# Patient Record
Sex: Female | Born: 1955 | Race: White | Hispanic: No | Marital: Married | Smoking: Former smoker
Health system: Southern US, Community
[De-identification: ages and names within clinical notes are randomized; demographics above are authoritative.]

## PROBLEM LIST (undated history)

## (undated) DIAGNOSIS — I10 Essential (primary) hypertension: Secondary | ICD-10-CM

## (undated) DIAGNOSIS — G44009 Cluster headache syndrome, unspecified, not intractable: Secondary | ICD-10-CM

## (undated) DIAGNOSIS — F32A Depression, unspecified: Secondary | ICD-10-CM

## (undated) DIAGNOSIS — F329 Major depressive disorder, single episode, unspecified: Secondary | ICD-10-CM

## (undated) DIAGNOSIS — I700 Atherosclerosis of aorta: Secondary | ICD-10-CM

## (undated) DIAGNOSIS — E559 Vitamin D deficiency, unspecified: Secondary | ICD-10-CM

## (undated) DIAGNOSIS — K219 Gastro-esophageal reflux disease without esophagitis: Secondary | ICD-10-CM

## (undated) DIAGNOSIS — K635 Polyp of colon: Secondary | ICD-10-CM

## (undated) DIAGNOSIS — E7800 Pure hypercholesterolemia, unspecified: Secondary | ICD-10-CM

## (undated) DIAGNOSIS — J449 Chronic obstructive pulmonary disease, unspecified: Secondary | ICD-10-CM

## (undated) DIAGNOSIS — I2510 Atherosclerotic heart disease of native coronary artery without angina pectoris: Principal | ICD-10-CM

## (undated) HISTORY — DX: Chronic obstructive pulmonary disease, unspecified: J44.9

## (undated) HISTORY — DX: Gastro-esophageal reflux disease without esophagitis: K21.9

## (undated) HISTORY — DX: Depression, unspecified: F32.A

## (undated) HISTORY — DX: Essential (primary) hypertension: I10

## (undated) HISTORY — DX: Cluster headache syndrome, unspecified, not intractable: G44.009

## (undated) HISTORY — DX: Vitamin D deficiency, unspecified: E55.9

## (undated) HISTORY — DX: Atherosclerotic heart disease of native coronary artery without angina pectoris: I25.10

## (undated) HISTORY — PX: HAND SURGERY: SHX662

## (undated) HISTORY — DX: Pure hypercholesterolemia, unspecified: E78.00

## (undated) HISTORY — DX: Major depressive disorder, single episode, unspecified: F32.9

## (undated) HISTORY — DX: Atherosclerosis of aorta: I70.0

## (undated) HISTORY — DX: Polyp of colon: K63.5

---

## 1997-12-11 ENCOUNTER — Ambulatory Visit (HOSPITAL_COMMUNITY): Admission: RE | Admit: 1997-12-11 | Discharge: 1997-12-11 | Payer: Self-pay | Admitting: *Deleted

## 1997-12-12 ENCOUNTER — Encounter: Payer: Self-pay | Admitting: *Deleted

## 2001-04-05 ENCOUNTER — Encounter: Admission: RE | Admit: 2001-04-05 | Discharge: 2001-04-05 | Payer: Self-pay | Admitting: Family Medicine

## 2001-04-05 ENCOUNTER — Encounter: Payer: Self-pay | Admitting: Family Medicine

## 2002-09-19 ENCOUNTER — Other Ambulatory Visit: Admission: RE | Admit: 2002-09-19 | Discharge: 2002-09-19 | Payer: Self-pay | Admitting: *Deleted

## 2002-10-03 ENCOUNTER — Encounter: Admission: RE | Admit: 2002-10-03 | Discharge: 2002-10-03 | Payer: Self-pay | Admitting: Family Medicine

## 2002-10-03 ENCOUNTER — Encounter: Payer: Self-pay | Admitting: Family Medicine

## 2002-10-27 ENCOUNTER — Ambulatory Visit (HOSPITAL_BASED_OUTPATIENT_CLINIC_OR_DEPARTMENT_OTHER): Admission: RE | Admit: 2002-10-27 | Discharge: 2002-10-27 | Payer: Self-pay | Admitting: Obstetrics and Gynecology

## 2002-10-27 ENCOUNTER — Encounter (INDEPENDENT_AMBULATORY_CARE_PROVIDER_SITE_OTHER): Payer: Self-pay | Admitting: Specialist

## 2003-10-16 ENCOUNTER — Other Ambulatory Visit: Admission: RE | Admit: 2003-10-16 | Discharge: 2003-10-16 | Payer: Self-pay | Admitting: Obstetrics and Gynecology

## 2003-11-14 ENCOUNTER — Encounter: Admission: RE | Admit: 2003-11-14 | Discharge: 2003-11-14 | Payer: Self-pay | Admitting: Obstetrics and Gynecology

## 2005-03-12 ENCOUNTER — Encounter: Admission: RE | Admit: 2005-03-12 | Discharge: 2005-03-12 | Payer: Self-pay | Admitting: Family Medicine

## 2005-03-12 ENCOUNTER — Other Ambulatory Visit: Admission: RE | Admit: 2005-03-12 | Discharge: 2005-03-12 | Payer: Self-pay | Admitting: Family Medicine

## 2006-04-26 ENCOUNTER — Ambulatory Visit (HOSPITAL_COMMUNITY): Admission: RE | Admit: 2006-04-26 | Discharge: 2006-04-26 | Payer: Self-pay | Admitting: Family Medicine

## 2006-05-12 ENCOUNTER — Encounter: Admission: RE | Admit: 2006-05-12 | Discharge: 2006-05-12 | Payer: Self-pay | Admitting: Family Medicine

## 2006-05-28 ENCOUNTER — Other Ambulatory Visit: Admission: RE | Admit: 2006-05-28 | Discharge: 2006-05-28 | Payer: Self-pay | Admitting: Family Medicine

## 2006-10-22 ENCOUNTER — Encounter: Admission: RE | Admit: 2006-10-22 | Discharge: 2006-10-22 | Payer: Self-pay | Admitting: Family Medicine

## 2007-05-17 ENCOUNTER — Encounter: Admission: RE | Admit: 2007-05-17 | Discharge: 2007-05-17 | Payer: Self-pay | Admitting: Family Medicine

## 2007-08-31 ENCOUNTER — Other Ambulatory Visit: Admission: RE | Admit: 2007-08-31 | Discharge: 2007-08-31 | Payer: Self-pay | Admitting: Family Medicine

## 2009-03-04 ENCOUNTER — Encounter: Admission: RE | Admit: 2009-03-04 | Discharge: 2009-03-04 | Payer: Self-pay | Admitting: Family Medicine

## 2009-03-04 ENCOUNTER — Other Ambulatory Visit: Admission: RE | Admit: 2009-03-04 | Discharge: 2009-03-04 | Payer: Self-pay | Admitting: Family Medicine

## 2010-04-16 ENCOUNTER — Other Ambulatory Visit: Payer: Self-pay | Admitting: Family Medicine

## 2010-04-16 DIAGNOSIS — Z1231 Encounter for screening mammogram for malignant neoplasm of breast: Secondary | ICD-10-CM

## 2010-04-16 DIAGNOSIS — Z1239 Encounter for other screening for malignant neoplasm of breast: Secondary | ICD-10-CM

## 2010-04-28 ENCOUNTER — Other Ambulatory Visit (HOSPITAL_COMMUNITY)
Admission: RE | Admit: 2010-04-28 | Discharge: 2010-04-28 | Disposition: A | Discharge status: Home / Self Care | Payer: BC Managed Care – PPO | Source: Ambulatory Visit | Attending: Family Medicine | Admitting: Family Medicine

## 2010-04-28 ENCOUNTER — Ambulatory Visit
Admission: RE | Admit: 2010-04-28 | Discharge: 2010-04-28 | Disposition: A | Discharge status: Home / Self Care | Payer: Self-pay | Source: Ambulatory Visit | Attending: Family Medicine | Admitting: Family Medicine

## 2010-04-28 ENCOUNTER — Other Ambulatory Visit: Payer: Self-pay | Admitting: Family Medicine

## 2010-04-28 DIAGNOSIS — Z1231 Encounter for screening mammogram for malignant neoplasm of breast: Secondary | ICD-10-CM

## 2010-04-28 DIAGNOSIS — Z124 Encounter for screening for malignant neoplasm of cervix: Secondary | ICD-10-CM | POA: Insufficient documentation

## 2010-08-08 NOTE — Op Note (Signed)
NAME:  Laura Hayes, Laura Hayes                      ACCOUNT NO.:  [id number]   MEDICAL RECORD NO.:  [id number]                   PATIENT TYPE:  AMB   LOCATION:  [facility]                                 FACILITY:  [facility]   PHYSICIAN:  Laqueta Linden, M.D.                 DATE OF BIRTH:  [birth year]   DATE OF PROCEDURE:  10/27/2002  DATE OF DISCHARGE:                                 OPERATIVE REPORT   PREOPERATIVE DIAGNOSIS:  Perimenopausal abnormal bleeding due to endometrial  polyp.   POSTOPERATIVE DIAGNOSIS:   PROCEDURE:  Hysteroscopic resection with curettage.   SURGEON:  Laqueta Linden, M.D.   ANESTHESIA:  General LMA.   ESTIMATED BLOOD LOSS:  Less than 20 mL.   SORBITOL NET INTAKE:  Less than 100 mL.   COMPLICATIONS:  None.   INDICATIONS:  Laura Hayes is a 55 year old, para 2, perimenopausal  female, who presented with abnormal heavy and prolonged perimenopausal  bleeding.  She underwent endometrial sampling which was negative.  She  underwent an ultrasound with sonohysterogram revealing a 1.6 cm endometrial  polyp.  She has seen the informed consent film, voiced her understanding and  acceptance of all risks including but not limited to anesthesia risk,  infection, bleeding, uterine perforation with possible injury to underlying  structures requiring laparoscopy or laparotomy as well as the possibility of  regrowth of the polyp, the unlike possibility of malignancy in the polyp,  and need for further treatment as well as perimenopausal abnormal bleeding  related to hormonal fluctuations even once the polyp has been removed.  She  has seen the informed consent film, voiced her understanding and acceptance  of all risks, and agrees to proceed.   DESCRIPTION OF PROCEDURE:  The patient was taken to the operating room and  after proper identification and consents were ascertained, she was placed on  the operating room table in supine position.  After general LMA was  introduced, she was placed in the [place] stirrups, and the perineum and  vagina were prepped and draped in a routine sterile fashion.  A  transurethral Foley was placed which was removed at the conclusion of the  procedure.  Bimanual examination confirmed an anterior, very small, mobile  uterus.  A speculum was placed in the vagina.  The uterine cavity sounded to  7 cm.  The internal os was gently dilated to a #33 Pratt dilator.  The  resectoscope was then inserted, and a large polyp emanating from the fundus  and basically filling the cavity was identified.  The resectoscope was  placed on routine settings, and the polyp was systematically resected in  multiple pieces.  The base was cauterized.  Hemostasis was excellent.  There  were no other focal lesions noted.  Sharp curettage produced a minimal  amount of additional tissue with specimens sent separately.  Sorbitol  intakes and outputs were monitored throughout the procedure with a net  intake of less than 100 mL.  All instruments were then removed.  The  tenaculum site was hemostatic.  There was no active bleeding from the  cervix.  Estimated blood loss was less than 20 mL.  Specimens were sent to  pathology.  The patient received Toradol 30 mg IV, 30 mg IM at the  initiation of the procedure.  She was awakened, extubated, transferred to  the PACU.  She will be observed and discharged per  anesthesia protocol.  She is to follow up in the office in 2-3 weeks' time  or to call sooner for excessive pain, fever, bleeding, or other concerns.  She is given routine verbal and written discharge instructions and told to  take Advil or Aleve as needed for cramping.                                               Laqueta Linden, M.D.    LKS/MEDQ  D:  10/27/2002  T:  10/27/2002  Job:  [id number]   cc:   Duncan Dull, M.D.  [address]  [place]  [place] [address]  Fax: [phone]

## 2011-04-07 ENCOUNTER — Other Ambulatory Visit: Payer: Self-pay | Admitting: Family Medicine

## 2011-04-07 DIAGNOSIS — Z1231 Encounter for screening mammogram for malignant neoplasm of breast: Secondary | ICD-10-CM

## 2011-04-30 ENCOUNTER — Ambulatory Visit
Admission: RE | Admit: 2011-04-30 | Discharge: 2011-04-30 | Disposition: A | Discharge status: Home / Self Care | Payer: BC Managed Care – PPO | Source: Ambulatory Visit | Attending: Family Medicine | Admitting: Family Medicine

## 2011-04-30 DIAGNOSIS — Z1231 Encounter for screening mammogram for malignant neoplasm of breast: Secondary | ICD-10-CM

## 2011-05-06 ENCOUNTER — Other Ambulatory Visit: Payer: Self-pay | Admitting: Family Medicine

## 2011-05-06 DIAGNOSIS — R928 Other abnormal and inconclusive findings on diagnostic imaging of breast: Secondary | ICD-10-CM

## 2011-05-18 ENCOUNTER — Ambulatory Visit
Admission: RE | Admit: 2011-05-18 | Discharge: 2011-05-18 | Disposition: A | Discharge status: Home / Self Care | Payer: BC Managed Care – PPO | Source: Ambulatory Visit | Attending: Family Medicine | Admitting: Family Medicine

## 2011-05-18 DIAGNOSIS — R928 Other abnormal and inconclusive findings on diagnostic imaging of breast: Secondary | ICD-10-CM

## 2012-05-23 ENCOUNTER — Other Ambulatory Visit: Payer: Self-pay | Admitting: Family Medicine

## 2012-05-23 DIAGNOSIS — R921 Mammographic calcification found on diagnostic imaging of breast: Secondary | ICD-10-CM

## 2012-06-03 ENCOUNTER — Ambulatory Visit
Admission: RE | Admit: 2012-06-03 | Discharge: 2012-06-03 | Disposition: A | Discharge status: Home / Self Care | Payer: BC Managed Care – PPO | Source: Ambulatory Visit | Attending: Family Medicine | Admitting: Family Medicine

## 2012-06-03 DIAGNOSIS — R921 Mammographic calcification found on diagnostic imaging of breast: Secondary | ICD-10-CM

## 2012-10-21 ENCOUNTER — Other Ambulatory Visit: Payer: Self-pay | Admitting: Gastroenterology

## 2012-11-29 ENCOUNTER — Ambulatory Visit
Admission: RE | Admit: 2012-11-29 | Discharge: 2012-11-29 | Disposition: A | Discharge status: Home / Self Care | Payer: BC Managed Care – PPO | Source: Ambulatory Visit | Attending: Family Medicine | Admitting: Family Medicine

## 2012-11-29 ENCOUNTER — Other Ambulatory Visit: Payer: Self-pay | Admitting: Family Medicine

## 2012-11-29 DIAGNOSIS — M545 Low back pain: Secondary | ICD-10-CM

## 2013-01-04 ENCOUNTER — Ambulatory Visit (HOSPITAL_COMMUNITY): Payer: BC Managed Care – PPO | Admitting: Physical Therapy

## 2013-03-23 DIAGNOSIS — N200 Calculus of kidney: Secondary | ICD-10-CM

## 2013-03-23 HISTORY — DX: Calculus of kidney: N20.0

## 2013-03-24 ENCOUNTER — Ambulatory Visit (INDEPENDENT_AMBULATORY_CARE_PROVIDER_SITE_OTHER): Payer: BC Managed Care – PPO | Admitting: Internal Medicine

## 2013-03-24 ENCOUNTER — Other Ambulatory Visit: Payer: Self-pay

## 2013-03-24 DIAGNOSIS — R0600 Dyspnea, unspecified: Secondary | ICD-10-CM

## 2013-03-24 DIAGNOSIS — R0989 Other specified symptoms and signs involving the circulatory and respiratory systems: Secondary | ICD-10-CM

## 2013-03-24 DIAGNOSIS — R0609 Other forms of dyspnea: Secondary | ICD-10-CM

## 2013-03-24 LAB — PULMONARY FUNCTION TEST
DL/VA % pred: 76 %
DL/VA: 3.57 ml/min/mmHg/L
DLCO unc % pred: 76 %
DLCO unc: 17.6 ml/min/mmHg
FEF 25-75 POST: 1.73 L/s
FEF 25-75 PRE: 1.53 L/s
FEF2575-%Change-Post: 13 %
FEF2575-%Pred-Post: 74 %
FEF2575-%Pred-Pre: 66 %
FEV1-%Change-Post: 2 %
FEV1-%PRED-PRE: 93 %
FEV1-%Pred-Post: 96 %
FEV1-PRE: 2.33 L
FEV1-Post: 2.39 L
FEV1FVC-%Change-Post: 0 %
FEV1FVC-%Pred-Pre: 92 %
FEV6-%CHANGE-POST: 2 %
FEV6-%Pred-Post: 104 %
FEV6-%Pred-Pre: 102 %
FEV6-POST: 3.23 L
FEV6-PRE: 3.16 L
FEV6FVC-%Change-Post: 0 %
FEV6FVC-%PRED-POST: 101 %
FEV6FVC-%PRED-PRE: 101 %
FVC-%Change-Post: 2 %
FVC-%Pred-Post: 102 %
FVC-%Pred-Pre: 100 %
FVC-POST: 3.29 L
FVC-PRE: 3.22 L
POST FEV6/FVC RATIO: 98 %
Post FEV1/FVC ratio: 73 %
Pre FEV1/FVC ratio: 72 %
Pre FEV6/FVC Ratio: 98 %
RV % PRED: 113 %
RV: 2.19 L
TLC % pred: 110 %
TLC: 5.4 L

## 2013-03-24 NOTE — Progress Notes (Signed)
PFT done today. 

## 2013-08-28 ENCOUNTER — Other Ambulatory Visit: Payer: Self-pay | Admitting: Family Medicine

## 2013-08-28 ENCOUNTER — Ambulatory Visit
Admission: RE | Admit: 2013-08-28 | Discharge: 2013-08-28 | Disposition: A | Discharge status: Home / Self Care | Payer: BC Managed Care – PPO | Source: Ambulatory Visit

## 2013-08-28 ENCOUNTER — Other Ambulatory Visit (HOSPITAL_COMMUNITY)
Admission: RE | Admit: 2013-08-28 | Discharge: 2013-08-28 | Disposition: A | Discharge status: Home / Self Care | Payer: BC Managed Care – PPO | Source: Ambulatory Visit | Attending: Family Medicine | Admitting: Family Medicine

## 2013-08-28 ENCOUNTER — Other Ambulatory Visit: Payer: Self-pay

## 2013-08-28 DIAGNOSIS — Z1231 Encounter for screening mammogram for malignant neoplasm of breast: Secondary | ICD-10-CM

## 2013-08-28 DIAGNOSIS — Z124 Encounter for screening for malignant neoplasm of cervix: Secondary | ICD-10-CM | POA: Insufficient documentation

## 2013-08-29 LAB — CYTOLOGY - PAP

## 2014-06-05 ENCOUNTER — Encounter: Payer: Self-pay | Admitting: *Deleted

## 2014-06-06 ENCOUNTER — Ambulatory Visit (INDEPENDENT_AMBULATORY_CARE_PROVIDER_SITE_OTHER): Payer: BLUE CROSS/BLUE SHIELD | Admitting: Neurology

## 2014-06-06 ENCOUNTER — Encounter: Payer: Self-pay | Admitting: Neurology

## 2014-06-06 VITALS — BP 124/77 | HR 65 | Ht 63.0 in | Wt 135.0 lb

## 2014-06-06 DIAGNOSIS — G43009 Migraine without aura, not intractable, without status migrainosus: Secondary | ICD-10-CM | POA: Diagnosis not present

## 2014-06-06 MED ORDER — SUMATRIPTAN SUCCINATE 50 MG PO TABS: 50.0000 mg | ORAL_TABLET | ORAL | Status: DC | PRN

## 2014-06-06 NOTE — Progress Notes (Signed)
PATIENT: Laura Hayes DOB: 10-Feb-1956  HISTORICAL  Laura Hayes is a 59 yo RH female is referred by Dr. Venida Jarvis for evaluation of headaches  She had a history of hypertension, hyperlipidemia, long-standing history of migraine headaches, getting better over the past few years, but still has about 2 headaches each week, bilateral frontal, retro-orbital area headaches, pressure, relieved by sleep, cold press, ibuprofen, Tylenol as needed, trigger for her headaches are stress, sleep deprivation, hungry,  2 weeks ago, she had one episode of severe headaches, pounding, with light noise sensitivity, nauseous, lasting for more than one week  She denied lateralized motor or sensory deficit. Has never tried triptan treatment in the past, is using biofeedback technique, which has been helpful.  REVIEW OF SYSTEMS: Full 14 system review of systems performed and notable only for headaches, sleepiness, anxiety, not enough sleep, change in appetite, palpitation, ringing ears, cough, joint pain, joint swelling, achy muscles, running nose.  ALLERGIES: No Known Allergies  HOME MEDICATIONS: Current Outpatient Prescriptions  Medication Sig Dispense Refill  . acetaminophen (TYLENOL) 325 MG tablet Take 650 mg by mouth every 6 (six) hours as needed.    . ALPRAZolam (XANAX) 0.5 MG tablet Take 0.5 mg by mouth. 0.5 - 1 tablet qhs prn restlessnes.    Marland Kitchen buPROPion (WELLBUTRIN XL) 300 MG 24 hr tablet Take 300 mg by mouth daily.    . calcium carbonate (TUMS - DOSED IN MG ELEMENTAL CALCIUM) 500 MG chewable tablet Chew 1 tablet by mouth daily as needed for indigestion or heartburn.    . ergocalciferol (VITAMIN D2) 50000 UNITS capsule Take 50,000 Units by mouth once a week.    Marland Kitchen ibuprofen (ADVIL,MOTRIN) 200 MG tablet Take 200 mg by mouth every 6 (six) hours as needed.    Marland Kitchen lisinopril (PRINIVIL,ZESTRIL) 20 MG tablet Take 20 mg by mouth daily.  0  . omeprazole (PRILOSEC) 20 MG capsule Take 20 mg by mouth  daily.    . rosuvastatin (CRESTOR) 20 MG tablet Take 20 mg by mouth daily.    . sertraline (ZOLOFT) 100 MG tablet Take 100 mg by mouth daily.    . verapamil (CALAN-SR) 180 MG CR tablet   0     PAST MEDICAL HISTORY: Past Medical History  Diagnosis Date  . Hypertension   . Hypercholesteremia   . Colon polyps   . Vitamin D deficiency   . Depression   . COPD (chronic obstructive pulmonary disease)   . GERD (gastroesophageal reflux disease)   . Cluster headache     PAST SURGICAL HISTORY: Past Surgical History  Procedure Laterality Date  . Cesarean section    . Hand surgery Right     FAMILY HISTORY: Family History  Problem Relation Age of Onset  . Migraines Mother   . Transient ischemic attack Mother   . Dementia Mother   . Hypertension Mother   . Hyperlipidemia Mother   . Heart disease Father   . Lung disease Father   . Hypertension Father   . Emphysema Father   . COPD Father     SOCIAL HISTORY:  History   Social History  . Marital Status: Married    Spouse Name: N/A  . Number of Children: 1  . Years of Education: 13   Occupational History  . Photographer    Social History Main Topics  . Smoking status: Current Every Day Smoker    Types: E-cigarettes  . Smokeless tobacco: Not on file  . Alcohol Use:  0.0 oz/week    0 Standard drinks or equivalent per week     Comment: Seldom - social  . Drug Use: No     Comment: past hx of marijuana use in 70's  . Sexual Activity: Not on file   Other Topics Concern  . Not on file   Social History Narrative   Lives at home with husband.   Right-handed.   2 liter caffeine daily (diet soda).    PHYSICAL EXAM   Filed Vitals:   06/06/14 0834  BP: 124/77  Pulse: 65  Height: 5\' 3"  (1.6 m)  Weight: 135 lb (61.236 kg)    Not recorded      Body mass index is 23.92 kg/(m^2).  PHYSICAL EXAMNIATION:  Gen: NAD, conversant, well nourised, obese, well groomed                     Cardiovascular: Regular rate  rhythm, no peripheral edema, warm, nontender. Eyes: Conjunctivae clear without exudates or hemorrhage Neck: Supple, no carotid bruise. Pulmonary: Clear to auscultation bilaterally   NEUROLOGICAL EXAM:  MENTAL STATUS: Speech:    Speech is normal; fluent and spontaneous with normal comprehension.  Cognition:    The patient is oriented to person, place, and time;     recent and remote memory intact;     language fluent;     normal attention, concentration,     fund of knowledge.  CRANIAL NERVES: CN II: Visual fields are full to confrontation. Fundoscopic exam is normal with sharp discs and no vascular changes. Venous pulsations are present bilaterally. Pupils are 4 mm and briskly reactive to light. Visual acuity is 20/20 bilaterally. CN III, IV, VI: extraocular movement are normal. No ptosis. CN V: Facial sensation is intact to pinprick in all 3 divisions bilaterally. Corneal responses are intact.  CN VII: Face is symmetric with normal eye closure and smile. CN VIII: Hearing is normal to rubbing fingers CN IX, X: Palate elevates symmetrically. Phonation is normal. CN XI: Head turning and shoulder shrug are intact CN XII: Tongue is midline with normal movements and no atrophy.  MOTOR: There is no pronator drift of out-stretched arms. Muscle bulk and tone are normal. Muscle strength is normal.   Shoulder abduction Shoulder external rotation Elbow flexion Elbow extension Wrist flexion Wrist extension Finger abduction Hip flexion Knee flexion Knee extension Ankle dorsi flexion Ankle plantar flexion  R 5 5 5 5 5 5 5 5 5 5 5 5   L 5 5 5 5 5 5 5 5 5 5 5 5     REFLEXES: Reflexes are 2+ and symmetric at the biceps, triceps, knees, and ankles. Plantar responses are flexor.  SENSORY: Light touch, pinprick, position sense, and vibration sense are intact in fingers and toes.  COORDINATION: Rapid alternating movements and fine finger movements are intact. There is no dysmetria on  finger-to-nose and heel-knee-shin. There are no abnormal or extraneous movements.   GAIT/STANCE: Posture is normal. Gait is steady with normal steps, base, arm swing, and turning. Heel and toe walking are normal. Tandem gait is normal.  Romberg is absent.   DIAGNOSTIC DATA (LABS, IMAGING, TESTING) - I reviewed patient records, labs, notes, testing and imaging myself where available.  ASSESSMENT AND PLAN  LEESHA VENO is a 59 y.o. female  with long-standing history of migraine, now presenting with frequent headaches, normal neurological examinations  1, most consistent with migraine 2, Imitrex as needed for abortive treatment 3. Return to clinic in 3  months    Marcial Pacas, M.D. Ph.D.  [facility] [address], [place], [place] [address] Ph: [phone] Fax: [phone]

## 2014-09-19 ENCOUNTER — Encounter: Payer: Self-pay | Admitting: Cardiovascular Disease

## 2014-10-01 DIAGNOSIS — R000 Tachycardia, unspecified: Secondary | ICD-10-CM | POA: Insufficient documentation

## 2014-10-02 ENCOUNTER — Ambulatory Visit (INDEPENDENT_AMBULATORY_CARE_PROVIDER_SITE_OTHER): Payer: BLUE CROSS/BLUE SHIELD

## 2014-10-02 DIAGNOSIS — R000 Tachycardia, unspecified: Secondary | ICD-10-CM

## 2016-01-23 ENCOUNTER — Other Ambulatory Visit: Payer: Self-pay | Admitting: Neurology

## 2016-01-28 ENCOUNTER — Other Ambulatory Visit: Payer: Self-pay | Admitting: Family Medicine

## 2016-01-28 DIAGNOSIS — Z1231 Encounter for screening mammogram for malignant neoplasm of breast: Secondary | ICD-10-CM

## 2016-02-11 ENCOUNTER — Ambulatory Visit: Payer: BLUE CROSS/BLUE SHIELD

## 2016-07-24 ENCOUNTER — Other Ambulatory Visit: Payer: Self-pay | Admitting: Family Medicine

## 2016-07-24 DIAGNOSIS — Z122 Encounter for screening for malignant neoplasm of respiratory organs: Secondary | ICD-10-CM

## 2016-07-27 ENCOUNTER — Ambulatory Visit: Payer: BLUE CROSS/BLUE SHIELD

## 2016-07-28 ENCOUNTER — Ambulatory Visit
Admission: RE | Admit: 2016-07-28 | Discharge: 2016-07-28 | Disposition: A | Discharge status: Home / Self Care | Payer: BLUE CROSS/BLUE SHIELD | Source: Ambulatory Visit | Attending: Family Medicine | Admitting: Family Medicine

## 2016-07-28 DIAGNOSIS — Z122 Encounter for screening for malignant neoplasm of respiratory organs: Secondary | ICD-10-CM

## 2016-08-06 ENCOUNTER — Telehealth: Payer: Self-pay

## 2016-08-06 NOTE — Telephone Encounter (Signed)
SENT NOTES TO SCHEDULING 

## 2016-08-10 ENCOUNTER — Telehealth: Payer: Self-pay | Admitting: Cardiology

## 2016-08-10 NOTE — Telephone Encounter (Signed)
Received records from [facility] for appointment on 09/03/16 with Dr Ellyn Hack.  Records put with Dr Allison Quarry schedule for 09/03/16.

## 2016-08-27 ENCOUNTER — Other Ambulatory Visit: Payer: Self-pay

## 2016-09-03 ENCOUNTER — Ambulatory Visit (INDEPENDENT_AMBULATORY_CARE_PROVIDER_SITE_OTHER): Payer: BLUE CROSS/BLUE SHIELD | Admitting: Cardiology

## 2016-09-03 ENCOUNTER — Encounter: Payer: Self-pay | Admitting: Cardiology

## 2016-09-03 VITALS — BP 122/80 | HR 51 | Ht 63.0 in | Wt 122.0 lb

## 2016-09-03 DIAGNOSIS — E7800 Pure hypercholesterolemia, unspecified: Secondary | ICD-10-CM

## 2016-09-03 DIAGNOSIS — R0609 Other forms of dyspnea: Secondary | ICD-10-CM

## 2016-09-03 DIAGNOSIS — I2510 Atherosclerotic heart disease of native coronary artery without angina pectoris: Secondary | ICD-10-CM

## 2016-09-03 DIAGNOSIS — I10 Essential (primary) hypertension: Secondary | ICD-10-CM | POA: Diagnosis not present

## 2016-09-03 DIAGNOSIS — Z8249 Family history of ischemic heart disease and other diseases of the circulatory system: Secondary | ICD-10-CM | POA: Diagnosis not present

## 2016-09-03 DIAGNOSIS — E785 Hyperlipidemia, unspecified: Secondary | ICD-10-CM | POA: Insufficient documentation

## 2016-09-03 DIAGNOSIS — I700 Atherosclerosis of aorta: Secondary | ICD-10-CM | POA: Insufficient documentation

## 2016-09-03 NOTE — Progress Notes (Signed)
PCP: Darcus Austin, MD  Clinic Note: Chief Complaint  Patient presents with  . New Patient (Initial Visit)    has plaque buildup in aorta    HPI: Laura Hayes is a 61 y.o. female who is being seen today for the evaluation of evaluation of coronary calcification on CT at the request of Darcus Austin, MD. She is a former long-term smoker who quit smoking about a year ago. She had a CT scan done to evaluate for lung cancer screening that is demonstrated coronary artery and aortic calcification.  Laura Hayes was seen on 08/05/2016 by Dr. Inda Merlin to discuss the results of a Chest CT that demonstrates coronary artery & aortic calcification.  Recent Hospitalizations: none  Studies Personally Reviewed - (if available, images/films reviewed: From Epic Chart or Care Everywhere)  CT Chest (for Lung Ca Screening): Normal heart size. No pericardial effusion, thickening or record of calcification. Aortic (both ascending and descending) and great vessel atherosclerosis noted. Atherosclerosis of coronary arteries noted including LAD and RCA along with aortic valve. Multiple small pulmonary nodules noted. Right renal calculus  Interval History: Laura Hayes presents here today to discuss the results of her CT scan. He tries to do daily walking and spends lots of time babysitting her grandchild. She does have exertional shortness of breath, but has attributed this to a long-term smoking. She really doesn't have chest discomfort with exertion, but has not been doing the level of activity that she once did. She is hoping to get into an excised class and is concerned about the findings on the CT scan. She has a relatively significant family history of coronary disease that was essentially silent until her family and had an event. She denies any PND, orthopnea or edema. No rapid irregular heartbeats or palpitations. No sick be/near syncope or TIA symptoms amaurosis fugax. No claudication.    ROS: A  comprehensive was performed. Review of Systems  Constitutional: Negative for malaise/fatigue.  HENT: Positive for congestion. Negative for nosebleeds.   Eyes: Negative for blurred vision.  Respiratory: Positive for cough (chronic (ACE-I was stopped), but still present), sputum production (when allergies active.) and shortness of breath (if exerts heavily.).   Cardiovascular: Negative for claudication and leg swelling.  Gastrointestinal: Negative for blood in stool, constipation, diarrhea and melena.       Some loose stool  Genitourinary: Negative for hematuria.  Musculoskeletal: Positive for back pain (some LBP - sore) and joint pain (wrist tendonitis. L>R). Negative for myalgias.  Skin: Negative.   Neurological: Positive for dizziness (-balance). Negative for focal weakness.       Poor balance - equilibrium; has difficulty doing over the head exercises. .   Endo/Heme/Allergies: Positive for environmental allergies (seasonal - takes Claritin).  Psychiatric/Behavioral: The patient has insomnia (difficult falling asleep - has tried melatonin but not very helpful.  occasionally takes xanax. ).   All other systems reviewed and are negative.   I have reviewed and (if needed) personally updated the patient's problem list, medications, allergies, past medical and surgical history, social and family history.   Past Medical History:  Diagnosis Date  . Aortic calcification (HCC)   . Cluster headache   . Colon polyps   . COPD (chronic obstructive pulmonary disease) ([facility])   . Coronary artery calcification seen on CAT scan   . Depression   . Essential hypertension   . GERD (gastroesophageal reflux disease)   . Hypercholesteremia   . Vitamin D deficiency     Past Surgical  History:  Procedure Laterality Date  . CESAREAN SECTION    . HAND SURGERY Right     Current Meds  Medication Sig  . acetaminophen (TYLENOL) 325 MG tablet Take 650 mg by mouth every 6 (six) hours as needed.  .  ALPRAZolam (XANAX) 0.5 MG tablet Take 0.5 mg by mouth. 0.5 - 1 tablet qhs prn restlessnes.  Marland Kitchen buPROPion (WELLBUTRIN XL) 300 MG 24 hr tablet Take 300 mg by mouth daily.  . calcium carbonate (TUMS - DOSED IN MG ELEMENTAL CALCIUM) 500 MG chewable tablet Chew 1 tablet by mouth daily as needed for indigestion or heartburn.  . ergocalciferol (VITAMIN D2) 50000 UNITS capsule Take 50,000 Units by mouth once a week.  Marland Kitchen ibuprofen (ADVIL,MOTRIN) 200 MG tablet Take 200 mg by mouth every 6 (six) hours as needed.  Marland Kitchen losartan (COZAAR) 100 MG tablet Take 1 tablet by mouth daily.  . rosuvastatin (CRESTOR) 20 MG tablet Take 20 mg by mouth daily.  . sertraline (ZOLOFT) 100 MG tablet Take 100 mg by mouth daily.  . SUMAtriptan (IMITREX) 50 MG tablet Take 1 tablet (50 mg total) by mouth every 2 (two) hours as needed for migraine. May repeat in 2 hours if headache persists or recurs.  . verapamil (CALAN-SR) 180 MG CR tablet     Allergies  Allergen Reactions  . Lisinopril Cough    Social History   Social History  . Marital status: Married    Spouse name: N/A  . Number of children: 1  . Years of education: 15   Occupational History  . Photographer    Social History Main Topics  . Smoking status: Former Smoker    Types: E-cigarettes    Quit date: 03/06/2015  . Smokeless tobacco: Never Used  . Alcohol use 0.6 oz/week    1 Standard drinks or equivalent per week     Comment: Seldom - social  . Drug use: No     Comment: past hx of marijuana use in 70's  . Sexual activity: Yes   Other Topics Concern  . None   Social History Narrative   Lives at home with husband.   Had 3 total children, 2 living.  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   Right-handed.   2 liter caffeine daily (diet soda).    family history includes COPD in her father; Coronary artery disease in her father; Dementia in her mother; Emphysema in her father; Heart attack in her brother,  maternal grandfather, and paternal grandfather; Hyperlipidemia in her mother; Hypertension in her brother, father, and mother; Lung disease in her father; Migraines in her mother; Other in her son; Stroke in her brother and maternal grandmother; Transient ischemic attack in her mother.  Wt Readings from Last 3 Encounters:  09/03/16 122 lb (55.3 kg)  06/06/14 135 lb (61.2 kg)    PHYSICAL EXAM BP 122/80 (BP Location: Right Arm, Patient Position: Sitting, Cuff Size: Normal)   Pulse (!) 51   Ht 5\' 3"  (1.6 m)   Wt 122 lb (55.3 kg)   BMI 21.61 kg/m  General appearance: alert, cooperative, appears stated age, no distress. Healthy appearing.  Well nourished & well-groomed HEENT: [place]/AT, EOMI, MMM, anicteric sclera Neck: no adenopathy, no carotid bruit and no JVD Lungs: clear to auscultation bilaterally, normal percussion bilaterally and non-labored Heart: regular rate and rhythm, S1 &S2 normal, no murmur, click, rub or gallop; non-displaced PMI Abdomen: soft, non-tender; bowel sounds normal; no masses,  no  organomegaly; no HJR Extremities: extremities normal, atraumatic, no cyanosis, or edema  Pulses: 2+ and symmetric;  Skin: mobility and turgor normal, no lesions. Neurologic: Mental status: Alert & oriented x 3, thought content appropriate; non-focal exam.  Pleasant mood & affect. Cranial nerves: normal (II-XII grossly intact)  PAD Screen 09/03/2016  Previous PAD dx? No  Previous surgical procedure? No  Pain with walking? Yes  Subsides with rest? Yes  Feet/toe relief with dangling? No  Painful, non-healing ulcers? No  Extremities discolored? No  - not limiting - just ache a bit.   Adult ECG Report  Rate: 51 ;  Rhythm: sinus bradycardia and Otherwise normal axis, intervals and durations.;   Narrative Interpretation: Otherwise normal EKG.   Other studies Reviewed: Additional studies/ records that were reviewed today include:  Recent Labs: 614/2018  Na+ 138, K+ 4.2, Cl- 104, HCO3- 27  , BUN 7, Cr 0.76, Glu 93, Ca2+ 9.8; AST 17, ALT, 17, AlkP 107,  T Bili 0.5, TP 6.8,Alb 4.27 September 2015  TC 166, TG 95, HDL 71, LDL 76    ASSESSMENT / PLAN: Problem List Items Addressed This Visit    Coronary artery calcification seen on CAT scan - Primary    Two-vessel coronary calcification noted on CT scan which is somewhat concerning him and that his LAD and RCA. There was not a quantification of the extent of coronary disease, however two-vessel disease she would likely have a relatively high coronary calcium score. In light of the fact she does have some exertional dyspnea and is hoping to do an exercise class, I do want to evaluate for potential ischemic lesions. It would be nice to focus specifically on the coronaries that were noted on CT scan. As such, I think doing coronary CTA with the ability to do CT FFR is a better option than a Myoview stress test which may simply just showed no ischemia but not estimate the severity of her disease.  It would be nice to know how aggressive we would need to be with lipid and hypertension control      Relevant Orders   EKG 12-Lead (Completed)   CT CORONARY MORPH W/CTA COR W/SCORE W/CA W/CM &/OR WO/CM   CT CORONARY FRACTIONAL FLOW RESERVE DATA PREP   CT CORONARY FRACTIONAL FLOW RESERVE FLUID ANALYSIS   Essential hypertension (Chronic)    Well-controlled with verapamil and ARB.      Relevant Orders   EKG 12-Lead (Completed)   CT CORONARY MORPH W/CTA COR W/SCORE W/CA W/CM &/OR WO/CM   CT CORONARY FRACTIONAL FLOW RESERVE DATA PREP   CT CORONARY FRACTIONAL FLOW RESERVE FLUID ANALYSIS   Exertional dyspnea    Although this was not something that she openly mentioned, in passing we did discuss her exercise tolerance level and that is gone down some. She I think contributes it to her smoking history, however cannot exclude an ischemic equivalent. She says that her father never had chest pain There was suggestion of aortic valve calcification, but I  did not hear a murmur, therefore is not likely to be causing an issue.  We discussed various options to evaluate her coronary anatomy, with the advent of coronary CTA with CT FFR, I think this is now becoming a better option to evaluate exertional dyspnea in a patient who has clearly evidence of coronary disease, this will allow [place] to determine extent of disease in both the LAD and RCA and then potentially evaluate for ischemia due to a specific lesion.  Plan for  now is to order coronary CTA with potential CT FFR.      Family history of premature CAD (Chronic)    Relatively significant family history in her father coronary disease that was relatively silent. Clearly has at least two-vessel disease noted on CT scan. Plan is to determine extent of disease and potential severity of disease using coronary CTA/CT FFR.      Relevant Orders   EKG 12-Lead (Completed)   CT CORONARY MORPH W/CTA COR W/SCORE W/CA W/CM &/OR WO/CM   CT CORONARY FRACTIONAL FLOW RESERVE DATA PREP   CT CORONARY FRACTIONAL FLOW RESERVE FLUID ANALYSIS   Hypercholesteremia    She is on Crestor. Last set of labs are actually pretty well-controlled for her obvious evidence of coronary disease. LDL goal would be closer to 70 than 100.      Hyperlipidemia with target LDL less than 70 (Chronic)   Relevant Orders   EKG 12-Lead (Completed)   Thoracic aortic atherosclerosis (HCC) (Chronic)    I did not order this occasion, because that was not the focus of our evaluation, however I think it would not be unreasonable to do a screening abdominal aortic Doppler ultrasound was feet verified the presence or absence of significant coronary disease.      Relevant Orders   EKG 12-Lead (Completed)   CT CORONARY MORPH W/CTA COR W/SCORE W/CA W/CM &/OR WO/CM   CT CORONARY FRACTIONAL FLOW RESERVE DATA PREP   CT CORONARY FRACTIONAL FLOW RESERVE FLUID ANALYSIS      Current medicines are reviewed at length with the patient today. (+/-  concerns) n/a The following changes have been made: n/a  Patient Instructions  SCHEDULE AT [facility] has requested that you have cardiac CT. Cardiac computed tomography (CT) is a painless test that uses an x-ray machine to take clear, detailed pictures of your heart. For further information please visit [email or web address]. Please follow instruction sheet as given. WILL BE Schedule for July OR AUGUST 2018.    Your physician recommends that you schedule a follow-up appointment in 2 months with DR Selinda Korzeniewski- AFTER THE ABOVE TEST RESULTS.       Studies Ordered:   Orders Placed This Encounter  Procedures  . CT CORONARY MORPH W/CTA COR W/SCORE W/CA W/CM &/OR WO/CM  . CT CORONARY FRACTIONAL FLOW RESERVE DATA PREP  . CT CORONARY FRACTIONAL FLOW RESERVE FLUID ANALYSIS  . EKG 12-Lead      Glenetta Hew, M.D., M.S. Interventional Cardiologist   Pager # [phone] Phone # [phone] [address] [place], [place] [address]

## 2016-09-03 NOTE — Patient Instructions (Signed)
SCHEDULE AT [facility] has requested that you have cardiac CT. Cardiac computed tomography (CT) is a painless test that uses an x-ray machine to take clear, detailed pictures of your heart. For further information please visit [email or web address]. Please follow instruction sheet as given. WILL BE Schedule for July OR AUGUST 2018.    Your physician recommends that you schedule a follow-up appointment in 2 months with DR HARDING- AFTER THE ABOVE TEST RESULTS.

## 2016-09-09 ENCOUNTER — Encounter: Payer: Self-pay | Admitting: Cardiology

## 2016-09-09 DIAGNOSIS — E7800 Pure hypercholesterolemia, unspecified: Secondary | ICD-10-CM | POA: Insufficient documentation

## 2016-09-09 DIAGNOSIS — R0609 Other forms of dyspnea: Secondary | ICD-10-CM

## 2016-09-09 DIAGNOSIS — I700 Atherosclerosis of aorta: Secondary | ICD-10-CM | POA: Insufficient documentation

## 2016-09-09 NOTE — Assessment & Plan Note (Signed)
She is on Crestor. Last set of labs are actually pretty well-controlled for her obvious evidence of coronary disease. LDL goal would be closer to 70 than 100.

## 2016-09-09 NOTE — Assessment & Plan Note (Signed)
Well-controlled with verapamil and ARB.

## 2016-09-09 NOTE — Assessment & Plan Note (Signed)
Two-vessel coronary calcification noted on CT scan which is somewhat concerning him and that his LAD and RCA. There was not a quantification of the extent of coronary disease, however two-vessel disease she would likely have a relatively high coronary calcium score. In light of the fact she does have some exertional dyspnea and is hoping to do an exercise class, I do want to evaluate for potential ischemic lesions. It would be nice to focus specifically on the coronaries that were noted on CT scan. As such, I think doing coronary CTA with the ability to do CT FFR is a better option than a Myoview stress test which may simply just showed no ischemia but not estimate the severity of her disease.  It would be nice to know how aggressive we would need to be with lipid and hypertension control

## 2016-09-09 NOTE — Assessment & Plan Note (Signed)
I did not order this occasion, because that was not the focus of our evaluation, however I think it would not be unreasonable to do a screening abdominal aortic Doppler ultrasound was feet verified the presence or absence of significant coronary disease.

## 2016-09-09 NOTE — Assessment & Plan Note (Signed)
Although this was not something that she openly mentioned, in passing we did discuss her exercise tolerance level and that is gone down some. She I think contributes it to her smoking history, however cannot exclude an ischemic equivalent. She says that her father never had chest pain There was suggestion of aortic valve calcification, but I did not hear a murmur, therefore is not likely to be causing an issue.  We discussed various options to evaluate her coronary anatomy, with the advent of coronary CTA with CT FFR, I think this is now becoming a better option to evaluate exertional dyspnea in a patient who has clearly evidence of coronary disease, this will allow [place] to determine extent of disease in both the LAD and RCA and then potentially evaluate for ischemia due to a specific lesion.  Plan for now is to order coronary CTA with potential CT FFR.

## 2016-09-09 NOTE — Assessment & Plan Note (Signed)
Relatively significant family history in her father coronary disease that was relatively silent. Clearly has at least two-vessel disease noted on CT scan. Plan is to determine extent of disease and potential severity of disease using coronary CTA/CT FFR.

## 2016-09-15 ENCOUNTER — Encounter: Payer: Self-pay | Admitting: Cardiology

## 2016-09-20 DIAGNOSIS — I2510 Atherosclerotic heart disease of native coronary artery without angina pectoris: Secondary | ICD-10-CM

## 2016-09-20 DIAGNOSIS — R931 Abnormal findings on diagnostic imaging of heart and coronary circulation: Secondary | ICD-10-CM

## 2016-09-20 HISTORY — DX: Abnormal findings on diagnostic imaging of heart and coronary circulation: R93.1

## 2016-09-20 HISTORY — DX: Atherosclerotic heart disease of native coronary artery without angina pectoris: I25.10

## 2016-09-30 ENCOUNTER — Ambulatory Visit (HOSPITAL_COMMUNITY)
Admission: RE | Admit: 2016-09-30 | Discharge: 2016-09-30 | Disposition: A | Discharge status: Home / Self Care | Payer: BLUE CROSS/BLUE SHIELD | Source: Ambulatory Visit | Attending: Cardiology | Admitting: Cardiology

## 2016-09-30 ENCOUNTER — Encounter (HOSPITAL_COMMUNITY): Payer: Self-pay

## 2016-09-30 ENCOUNTER — Telehealth: Payer: Self-pay

## 2016-09-30 DIAGNOSIS — R079 Chest pain, unspecified: Secondary | ICD-10-CM | POA: Diagnosis not present

## 2016-09-30 DIAGNOSIS — Z8249 Family history of ischemic heart disease and other diseases of the circulatory system: Secondary | ICD-10-CM | POA: Diagnosis present

## 2016-09-30 DIAGNOSIS — I2510 Atherosclerotic heart disease of native coronary artery without angina pectoris: Secondary | ICD-10-CM | POA: Diagnosis present

## 2016-09-30 DIAGNOSIS — I10 Essential (primary) hypertension: Secondary | ICD-10-CM

## 2016-09-30 DIAGNOSIS — I700 Atherosclerosis of aorta: Secondary | ICD-10-CM

## 2016-09-30 DIAGNOSIS — R911 Solitary pulmonary nodule: Secondary | ICD-10-CM | POA: Diagnosis not present

## 2016-09-30 LAB — POCT I-STAT CREATININE: CREATININE: 0.7 mg/dL (ref 0.44–1.00)

## 2016-09-30 MED ORDER — NITROGLYCERIN 0.4 MG SL SUBL
SUBLINGUAL_TABLET | SUBLINGUAL | Status: AC
  Administered 2016-09-30: 0.8 mg via SUBLINGUAL
  Filled 2016-09-30: qty 2

## 2016-09-30 MED ORDER — METOPROLOL TARTRATE 5 MG/5ML IV SOLN
5.0000 mg | Freq: Once | INTRAVENOUS | Status: AC
  Administered 2016-09-30: 5 mg via INTRAVENOUS
  Filled 2016-09-30: qty 5

## 2016-09-30 MED ORDER — METOPROLOL TARTRATE 5 MG/5ML IV SOLN
INTRAVENOUS | Status: AC
  Administered 2016-09-30: 5 mg via INTRAVENOUS
  Filled 2016-09-30: qty 10

## 2016-09-30 MED ORDER — NITROGLYCERIN 0.4 MG SL SUBL
0.8000 mg | SUBLINGUAL_TABLET | Freq: Once | SUBLINGUAL | Status: AC
  Administered 2016-09-30: 0.8 mg via SUBLINGUAL
  Filled 2016-09-30: qty 25

## 2016-09-30 MED ORDER — IOPAMIDOL (ISOVUE-370) INJECTION 76%
INTRAVENOUS | Status: AC
  Administered 2016-09-30: 80 mL
  Filled 2016-09-30: qty 100

## 2016-09-30 MED ORDER — METOPROLOL TARTRATE 5 MG/5ML IV SOLN
5.0000 mg | INTRAVENOUS | Status: DC
  Filled 2016-09-30: qty 5

## 2016-09-30 NOTE — Telephone Encounter (Signed)
Kelly in [place] at [facility] put in order for I stat creatinine, per protocol for CT procedures. Will forward to Dr. Ellyn Hack and his nurse, so they are aware.

## 2016-09-30 NOTE — Telephone Encounter (Signed)
I will be happy to cosign order.  Glenetta Hew, MD

## 2016-10-12 ENCOUNTER — Telehealth (HOSPITAL_COMMUNITY): Payer: Self-pay | Admitting: Cardiology

## 2016-10-12 NOTE — Telephone Encounter (Addendum)
Cardiac CT angiogram result:  -Based on the calcium score being 172, they proceeded with coronary angiogram and this did show areas of 30-50% narrowing in the right coronary artery and left anterior descending artery. These lesions would not be significant enough to consider evaluating with a cardiac catheterization as they're not likely to be causing angina symptoms.  What this does suggest is that she does have nonobstructive coronary artery disease that we need to treat aggressively with risk factor modification.  Noncardiac findings: Small pulmonary nodule noted in the right middle lobe. --We'll defer whether or not further follow-up will need be done to her PCP (Dr. Inda Merlin)  Glenetta Hew, MD    Patient aware and verbalized understanding.  Has appt coming up with PCP.  Request results forwarded to PCP.   Routed to Dr. Inda Merlin.   Patient also wondering if she needs to keep upcoming follow up with Dr. Ellyn Hack.  Advised that this would be good to discuss results further but would verify with him.

## 2016-10-12 NOTE — Telephone Encounter (Signed)
Patient aware and verbalized understanding.  States she will discuss with PCP at her f/u visit which is before appt with Dr. Ellyn Hack.  Will decide based on visit with PCP.

## 2016-10-12 NOTE — Telephone Encounter (Signed)
New message  ° ° °Patient calling for test results.   °

## 2016-10-12 NOTE — Telephone Encounter (Signed)
The study did not show occlusive CAD, but does confirm the presence of CAD.  I can explain that to her & discuss potential f/u as well as med Rx going forward.  If PCP is comfortable managing her going forward, I am OK with that as well.  Usually, they would prefer Cardiology to stay involved.    Glenetta Hew, MD

## 2016-10-21 ENCOUNTER — Other Ambulatory Visit (HOSPITAL_COMMUNITY)
Admission: RE | Admit: 2016-10-21 | Discharge: 2016-10-21 | Disposition: A | Discharge status: Home / Self Care | Payer: BLUE CROSS/BLUE SHIELD | Source: Ambulatory Visit | Attending: Family Medicine | Admitting: Family Medicine

## 2016-10-21 ENCOUNTER — Other Ambulatory Visit: Payer: Self-pay | Admitting: Family Medicine

## 2016-10-21 DIAGNOSIS — Z01411 Encounter for gynecological examination (general) (routine) with abnormal findings: Secondary | ICD-10-CM | POA: Diagnosis not present

## 2016-10-22 LAB — CYTOLOGY - PAP: Diagnosis: NEGATIVE

## 2016-11-02 ENCOUNTER — Ambulatory Visit
Admission: RE | Admit: 2016-11-02 | Discharge: 2016-11-02 | Disposition: A | Discharge status: Home / Self Care | Payer: BLUE CROSS/BLUE SHIELD | Source: Ambulatory Visit | Attending: Family Medicine | Admitting: Family Medicine

## 2016-11-02 ENCOUNTER — Ambulatory Visit: Payer: BLUE CROSS/BLUE SHIELD

## 2016-11-02 DIAGNOSIS — Z1231 Encounter for screening mammogram for malignant neoplasm of breast: Secondary | ICD-10-CM

## 2016-11-13 ENCOUNTER — Ambulatory Visit (INDEPENDENT_AMBULATORY_CARE_PROVIDER_SITE_OTHER): Payer: BLUE CROSS/BLUE SHIELD | Admitting: Cardiology

## 2016-11-13 ENCOUNTER — Encounter: Payer: Self-pay | Admitting: Cardiology

## 2016-11-13 VITALS — BP 108/80 | HR 68 | Ht 63.0 in | Wt 119.4 lb

## 2016-11-13 DIAGNOSIS — I10 Essential (primary) hypertension: Secondary | ICD-10-CM

## 2016-11-13 DIAGNOSIS — Z8249 Family history of ischemic heart disease and other diseases of the circulatory system: Secondary | ICD-10-CM

## 2016-11-13 DIAGNOSIS — I700 Atherosclerosis of aorta: Secondary | ICD-10-CM | POA: Diagnosis not present

## 2016-11-13 DIAGNOSIS — I2510 Atherosclerotic heart disease of native coronary artery without angina pectoris: Secondary | ICD-10-CM

## 2016-11-13 DIAGNOSIS — E785 Hyperlipidemia, unspecified: Secondary | ICD-10-CM

## 2016-11-13 NOTE — Patient Instructions (Signed)
SCHEDULE AT [place] Your physician has requested that you have a carotid duplex. This test is an ultrasound of the carotid arteries in your neck. It looks at blood flow through these arteries that supply the brain with blood. Allow one hour for this exam. There are no restrictions or special instructions.  Your physician has requested that you have an abdominal aorta duplex. During this test, an ultrasound is used to evaluate the aorta. Allow 30 minutes for this exam. Do not eat after midnight the day before and avoid carbonated beverages   Your physician recommends that you schedule a follow-up appointment in 2 MONTHS IF TEST IS ABNORMAL , IF NORMAL -APPOINTMENT ON AN AS NEEDED  BASIS.    If you need a refill on your cardiac medications before your next appointment, please call your pharmacy.

## 2016-11-13 NOTE — Progress Notes (Signed)
PCP: Darcus Austin, MD  Clinic Note: Chief Complaint  Patient presents with  . Follow-up    coronary calcification    HPI: Laura Hayes is a 61 y.o. female who is being seen today for thefollow-up evaluation of coronary artery calcification noted on CT scan at the request of Darcus Austin, MD.  Laura Hayes was initially seeen on 09/03/2016  - she really did not have any significant symptoms of chest discomfort dyspnea with rest or exertion.  Recent Hospitalizations: none  Studies Personally Reviewed - (if available, images/films reviewed: From Epic Chart or Care Everywhere)  Coronary calcium score-CTA 09/30/2016:   4 mm right upper lobe nodule. Nonspecific/benign.  Coronary calcium score: 172 with calcium and mid and distal RCA as well as mid LAD  Right dominant system. <50% mid LAD. <30% mid & distal RCA --> neither lesion is flow limiting   Interval History: She returns today doing quite well without any major symptoms.   No chest pain or shortness of breath with rest or exertion. No PND, orthopnea or edema. No palpitations, lightheadedness, dizziness, weakness or syncope/near syncope. No TIA/amaurosis fugax symptoms. No melena, hematochezia, hematuria, or epstaxis. No claudication.  ROS: A comprehensive was performed. Review of Systems  Constitutional: Negative for malaise/fatigue.  HENT: Negative for congestion.   Respiratory: Positive for cough (chronic).   Musculoskeletal: Positive for back pain (low back pain) and joint pain (wrist tendinitis).  Neurological: Negative for dizziness.  Endo/Heme/Allergies: Positive for environmental allergies.  Psychiatric/Behavioral: The patient has insomnia.   All other systems reviewed and are negative.   I have reviewed and (if needed) personally updated the patient's problem list, medications, allergies, past medical and surgical history, social and family history.   Past Medical History:  Diagnosis Date  .  Aortic calcification (HCC)   . Cluster headache   . Colon polyps   . COPD (chronic obstructive pulmonary disease) ([place])   . Coronary artery calcification seen on CAT scan   . Depression   . Essential hypertension   . GERD (gastroesophageal reflux disease)   . Hypercholesteremia   . Vitamin D deficiency     Past Surgical History:  Procedure Laterality Date  . CESAREAN SECTION    . HAND SURGERY Right     Current Meds  Medication Sig  . acetaminophen (TYLENOL) 325 MG tablet Take 650 mg by mouth every 6 (six) hours as needed.  Marland Kitchen buPROPion (WELLBUTRIN XL) 300 MG 24 hr tablet Take 300 mg by mouth daily.  . calcium carbonate (TUMS - DOSED IN MG ELEMENTAL CALCIUM) 500 MG chewable tablet Chew 1 tablet by mouth daily as needed for indigestion or heartburn.  . ergocalciferol (VITAMIN D2) 50000 UNITS capsule Take 50,000 Units by mouth once a week.  Marland Kitchen ibuprofen (ADVIL,MOTRIN) 200 MG tablet Take 200 mg by mouth every 6 (six) hours as needed.  Marland Kitchen losartan (COZAAR) 100 MG tablet Take 1 tablet by mouth daily.  . rosuvastatin (CRESTOR) 20 MG tablet Take 20 mg by mouth daily.  . sertraline (ZOLOFT) 100 MG tablet Take 100 mg by mouth daily.  . SUMAtriptan (IMITREX) 50 MG tablet Take 1 tablet (50 mg total) by mouth every 2 (two) hours as needed for migraine. May repeat in 2 hours if headache persists or recurs.  . verapamil (CALAN-SR) 180 MG CR tablet     Allergies  Allergen Reactions  . Lisinopril Cough    Social History   Social History  . Marital status: Married  Spouse name: N/A  . Number of children: 1  . Years of education: 21   Occupational History  . Photographer    Social History Main Topics  . Smoking status: Former Smoker    Types: E-cigarettes    Quit date: 03/06/2015  . Smokeless tobacco: Never Used  . Alcohol use 0.6 oz/week    1 Standard drinks or equivalent per week     Comment: Seldom - social  . Drug use: No     Comment: past hx of marijuana use in 70's    . Sexual activity: Yes   Other Topics Concern  . None   Social History Narrative   Lives at home with husband.   Had 3 total children, 2 living.  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   Right-handed.   2 liter caffeine daily (diet soda).    family history includes COPD in her father; Coronary artery disease in her father; Dementia in her mother; Emphysema in her father; Heart attack in her brother, maternal grandfather, and paternal grandfather; Hyperlipidemia in her mother; Hypertension in her brother, father, and mother; Lung disease in her father; Migraines in her mother; Other in her son; Stroke in her brother and maternal grandmother; Transient ischemic attack in her mother.  Wt Readings from Last 3 Encounters:  11/13/16 119 lb 6.4 oz (54.2 kg)  09/03/16 122 lb (55.3 kg)  06/06/14 135 lb (61.2 kg)    PHYSICAL EXAM BP 108/80   Pulse 68   Ht 5\' 3"  (1.6 m)   Wt 119 lb 6.4 oz (54.2 kg)   SpO2 98%   BMI 21.15 kg/m  Physical Exam  Constitutional: She is oriented to person, place, and time. She appears well-developed and well-nourished. No distress.  HENT:  Head: Normocephalic and atraumatic.  Cardiovascular: Normal rate, regular rhythm, normal heart sounds and intact distal pulses.  Exam reveals no gallop and no friction rub.   No murmur heard. Pulmonary/Chest: Effort normal and breath sounds normal. No respiratory distress. She has no wheezes.  Musculoskeletal: Normal range of motion. She exhibits no edema.  Neurological: She is alert and oriented to person, place, and time.  Skin: Skin is warm and dry.  Psychiatric: She has a normal mood and affect. Her behavior is normal. Judgment and thought content normal.  Nursing note and vitals reviewed.    Adult ECG Report None  Other studies Reviewed: Additional studies/ records that were reviewed today include:  Recent Labs:  - followed by PCP (08/2016: TC 166, TG 95, HDL 71, LDL 76  )  ASSESSMENT / PLAN: Problem List Items Addressed This Visit    Coronary artery calcification seen on CAT scan (Chronic)    Coronary CTA confirmed LAD and RCA disease, but nonobstructive and unlikely to be physiologically significant at this time. Plan will be continued risk factor modification. - continue statin, ARB and verapamil given no evidence of heart failure.  She should be fine starting exercise class..      Relevant Orders   VAS US CAROTID   AAA Duplex   Essential hypertension (Chronic)    Well-controlled on current meds.      Relevant Orders   VAS US CAROTID   AAA Duplex   Family history of premature CAD (Chronic)    As she is currently close of the case as her father was when he was diagnosed with coronary disease, this relatively reassuring, overall restricted to cases standpoint.  Hyperlipidemia with target LDL less than 70 (Chronic)    Currently taking Crestor 20 mg daily. Most recent check is pretty much at goal.Would just recommend continued exercise and I suspect it will continue to get closer to goal values.      Thoracic aortic atherosclerosis (HCC) - Primary (Chronic)    We previously discussed carotid Dopplers and abdominal aortic Dopplers given the extent of calcification noted in the thoracic aorta. Would like to exclude evidence of abdominal aorticdisease as well as carotid disease.  Plan:Abdominal aortic duplex and carotid Dopplers.      Relevant Orders   VAS US CAROTID   AAA Duplex      Current medicines are reviewed at length with the patient today. (+/- concerns) Asked about other screening for systemic Atherosclerosis. The following changes have been made: see below - check Carotd Duplex along with AAA [place].   Patient Instructions  SCHEDULE AT [place] Your physician has requested that you have a carotid duplex. This test is an ultrasound of the carotid arteries in your neck. It looks at blood flow through these  arteries that supply the brain with blood. Allow one hour for this exam. There are no restrictions or special instructions.  Your physician has requested that you have an abdominal aorta duplex. During this test, an ultrasound is used to evaluate the aorta. Allow 30 minutes for this exam. Do not eat after midnight the day before and avoid carbonated beverages   Your physician recommends that you schedule a follow-up appointment in 2 MONTHS IF TEST IS ABNORMAL , IF NORMAL -APPOINTMENT ON AN AS NEEDED  BASIS.    If you need a refill on your cardiac medications before your next appointment, please call your pharmacy.     Studies Ordered:   No orders of the defined types were placed in this encounter.     Glenetta Hew, M.D., M.S. Interventional Cardiologist   Pager # [phone] Phone # [phone] [address] [place], [place] [address]

## 2016-11-15 ENCOUNTER — Encounter: Payer: Self-pay | Admitting: Cardiology

## 2016-11-15 NOTE — Assessment & Plan Note (Signed)
As she is currently close of the case as her father was when he was diagnosed with coronary disease, this relatively reassuring, overall restricted to cases standpoint.

## 2016-11-15 NOTE — Assessment & Plan Note (Signed)
We previously discussed carotid Dopplers and abdominal aortic Dopplers given the extent of calcification noted in the thoracic aorta. Would like to exclude evidence of abdominal aorticdisease as well as carotid disease.  Plan:Abdominal aortic duplex and carotid Dopplers.

## 2016-11-15 NOTE — Assessment & Plan Note (Signed)
Currently taking Crestor 20 mg daily. Most recent check is pretty much at goal.Would just recommend continued exercise and I suspect it will continue to get closer to goal values.

## 2016-11-15 NOTE — Assessment & Plan Note (Signed)
Well-controlled on current meds 

## 2016-11-15 NOTE — Assessment & Plan Note (Signed)
Coronary CTA confirmed LAD and RCA disease, but nonobstructive and unlikely to be physiologically significant at this time. Plan will be continued risk factor modification. - continue statin, ARB and verapamil given no evidence of heart failure.  She should be fine starting exercise class.Marland Kitchen

## 2016-11-17 ENCOUNTER — Other Ambulatory Visit: Payer: Self-pay | Admitting: Cardiology

## 2016-11-17 DIAGNOSIS — I700 Atherosclerosis of aorta: Secondary | ICD-10-CM

## 2016-11-18 ENCOUNTER — Ambulatory Visit (HOSPITAL_COMMUNITY)
Admission: RE | Admit: 2016-11-18 | Discharge: 2016-11-18 | Disposition: A | Discharge status: Home / Self Care | Payer: BLUE CROSS/BLUE SHIELD | Source: Ambulatory Visit | Attending: Cardiology | Admitting: Cardiology

## 2016-11-18 DIAGNOSIS — I2510 Atherosclerotic heart disease of native coronary artery without angina pectoris: Secondary | ICD-10-CM | POA: Diagnosis present

## 2016-11-18 DIAGNOSIS — R42 Dizziness and giddiness: Secondary | ICD-10-CM | POA: Diagnosis not present

## 2016-11-18 DIAGNOSIS — I739 Peripheral vascular disease, unspecified: Secondary | ICD-10-CM | POA: Diagnosis not present

## 2016-11-18 DIAGNOSIS — I10 Essential (primary) hypertension: Secondary | ICD-10-CM | POA: Insufficient documentation

## 2016-11-18 DIAGNOSIS — I700 Atherosclerosis of aorta: Secondary | ICD-10-CM

## 2017-01-07 ENCOUNTER — Ambulatory Visit: Payer: BLUE CROSS/BLUE SHIELD | Admitting: Cardiology

## 2017-01-13 ENCOUNTER — Ambulatory Visit: Payer: BLUE CROSS/BLUE SHIELD | Admitting: Cardiovascular Disease

## 2017-01-26 ENCOUNTER — Other Ambulatory Visit: Payer: Self-pay | Admitting: Family Medicine

## 2017-01-26 DIAGNOSIS — R911 Solitary pulmonary nodule: Secondary | ICD-10-CM

## 2017-02-04 ENCOUNTER — Ambulatory Visit
Admission: RE | Admit: 2017-02-04 | Discharge: 2017-02-04 | Disposition: A | Discharge status: Home / Self Care | Payer: BLUE CROSS/BLUE SHIELD | Source: Ambulatory Visit | Attending: Family Medicine | Admitting: Family Medicine

## 2017-02-04 DIAGNOSIS — R911 Solitary pulmonary nodule: Secondary | ICD-10-CM

## 2017-11-08 ENCOUNTER — Other Ambulatory Visit: Payer: Self-pay | Admitting: Family Medicine

## 2017-11-08 DIAGNOSIS — Z1231 Encounter for screening mammogram for malignant neoplasm of breast: Secondary | ICD-10-CM

## 2017-12-09 ENCOUNTER — Ambulatory Visit
Admission: RE | Admit: 2017-12-09 | Discharge: 2017-12-09 | Disposition: A | Discharge status: Home / Self Care | Payer: BLUE CROSS/BLUE SHIELD | Source: Ambulatory Visit | Attending: Family Medicine | Admitting: Family Medicine

## 2017-12-09 DIAGNOSIS — Z1231 Encounter for screening mammogram for malignant neoplasm of breast: Secondary | ICD-10-CM

## 2018-01-26 ENCOUNTER — Other Ambulatory Visit: Payer: Self-pay | Admitting: Family Medicine

## 2018-01-26 DIAGNOSIS — Z0000 Encounter for general adult medical examination without abnormal findings: Secondary | ICD-10-CM

## 2018-01-31 ENCOUNTER — Ambulatory Visit
Admission: RE | Admit: 2018-01-31 | Discharge: 2018-01-31 | Disposition: A | Discharge status: Home / Self Care | Payer: BLUE CROSS/BLUE SHIELD | Source: Ambulatory Visit | Attending: Family Medicine | Admitting: Family Medicine

## 2018-01-31 DIAGNOSIS — Z0000 Encounter for general adult medical examination without abnormal findings: Secondary | ICD-10-CM

## 2018-09-27 DIAGNOSIS — Z0120 Encounter for dental examination and cleaning without abnormal findings: Secondary | ICD-10-CM | POA: Diagnosis not present

## 2018-12-12 DIAGNOSIS — Z0000 Encounter for general adult medical examination without abnormal findings: Secondary | ICD-10-CM | POA: Diagnosis not present

## 2018-12-12 DIAGNOSIS — F329 Major depressive disorder, single episode, unspecified: Secondary | ICD-10-CM | POA: Diagnosis not present

## 2018-12-12 DIAGNOSIS — Z23 Encounter for immunization: Secondary | ICD-10-CM | POA: Diagnosis not present

## 2018-12-12 DIAGNOSIS — J449 Chronic obstructive pulmonary disease, unspecified: Secondary | ICD-10-CM | POA: Diagnosis not present

## 2018-12-13 ENCOUNTER — Other Ambulatory Visit: Payer: Self-pay | Admitting: Family Medicine

## 2018-12-13 DIAGNOSIS — Z1231 Encounter for screening mammogram for malignant neoplasm of breast: Secondary | ICD-10-CM

## 2018-12-15 ENCOUNTER — Other Ambulatory Visit: Payer: Self-pay | Admitting: Family Medicine

## 2018-12-15 DIAGNOSIS — R911 Solitary pulmonary nodule: Secondary | ICD-10-CM

## 2018-12-22 ENCOUNTER — Ambulatory Visit
Admission: RE | Admit: 2018-12-22 | Discharge: 2018-12-22 | Disposition: A | Discharge status: Home / Self Care | Payer: BLUE CROSS/BLUE SHIELD | Source: Ambulatory Visit | Attending: Family Medicine | Admitting: Family Medicine

## 2018-12-22 DIAGNOSIS — R911 Solitary pulmonary nodule: Secondary | ICD-10-CM

## 2019-01-24 ENCOUNTER — Other Ambulatory Visit: Payer: Self-pay

## 2019-01-24 ENCOUNTER — Ambulatory Visit
Admission: RE | Admit: 2019-01-24 | Discharge: 2019-01-24 | Disposition: A | Discharge status: Home / Self Care | Payer: Medicare Other | Source: Ambulatory Visit | Attending: Family Medicine | Admitting: Family Medicine

## 2019-01-24 DIAGNOSIS — Z1231 Encounter for screening mammogram for malignant neoplasm of breast: Secondary | ICD-10-CM

## 2019-02-01 DIAGNOSIS — H5213 Myopia, bilateral: Secondary | ICD-10-CM | POA: Diagnosis not present

## 2019-02-06 ENCOUNTER — Ambulatory Visit: Payer: Medicare Other | Admitting: Cardiology

## 2019-03-27 ENCOUNTER — Ambulatory Visit: Payer: Medicare Other | Admitting: Cardiology

## 2019-04-04 DIAGNOSIS — Z0120 Encounter for dental examination and cleaning without abnormal findings: Secondary | ICD-10-CM | POA: Diagnosis not present

## 2019-05-31 ENCOUNTER — Encounter: Payer: Self-pay | Admitting: Cardiology

## 2019-06-12 DIAGNOSIS — E559 Vitamin D deficiency, unspecified: Secondary | ICD-10-CM | POA: Diagnosis not present

## 2019-06-12 DIAGNOSIS — F329 Major depressive disorder, single episode, unspecified: Secondary | ICD-10-CM | POA: Diagnosis not present

## 2019-06-12 DIAGNOSIS — I10 Essential (primary) hypertension: Secondary | ICD-10-CM | POA: Diagnosis not present

## 2019-06-12 DIAGNOSIS — K219 Gastro-esophageal reflux disease without esophagitis: Secondary | ICD-10-CM | POA: Diagnosis not present

## 2019-06-20 DIAGNOSIS — G500 Trigeminal neuralgia: Secondary | ICD-10-CM | POA: Diagnosis not present

## 2019-07-04 ENCOUNTER — Ambulatory Visit: Payer: Medicare Other | Admitting: Cardiology

## 2019-07-04 ENCOUNTER — Encounter: Payer: Self-pay | Admitting: Cardiology

## 2019-07-04 ENCOUNTER — Other Ambulatory Visit: Payer: Self-pay

## 2019-07-04 VITALS — BP 110/84 | HR 60 | Ht 63.0 in | Wt 128.4 lb

## 2019-07-04 DIAGNOSIS — I700 Atherosclerosis of aorta: Secondary | ICD-10-CM | POA: Diagnosis not present

## 2019-07-04 DIAGNOSIS — I10 Essential (primary) hypertension: Secondary | ICD-10-CM

## 2019-07-04 DIAGNOSIS — I359 Nonrheumatic aortic valve disorder, unspecified: Secondary | ICD-10-CM

## 2019-07-04 DIAGNOSIS — I2510 Atherosclerotic heart disease of native coronary artery without angina pectoris: Secondary | ICD-10-CM

## 2019-07-04 DIAGNOSIS — E785 Hyperlipidemia, unspecified: Secondary | ICD-10-CM | POA: Diagnosis not present

## 2019-07-04 DIAGNOSIS — Z8249 Family history of ischemic heart disease and other diseases of the circulatory system: Secondary | ICD-10-CM

## 2019-07-04 NOTE — Patient Instructions (Signed)
Medication Instructions:  No changes *If you need a refill on your cardiac medications before your next appointment, please call your pharmacy*   Lab Work: Not needed    Testing/Procedures: Will be schedule at [address] CT coronary calcium score. This test is done at [address]. This is $150 out of pocket.   Coronary CalciumScan A coronary calcium scan is an imaging test used to look for deposits of calcium and other fatty materials (plaques) in the inner lining of the blood vessels of the heart (coronary arteries). These deposits of calcium and plaques can partly clog and narrow the coronary arteries without producing any symptoms or warning signs. This puts a person at risk for a heart attack. This test can detect these deposits before symptoms develop. Tell a health care provider about:  Any allergies you have.  All medicines you are taking, including vitamins, herbs, eye drops, creams, and over-the-counter medicines.  Any problems you or family members have had with anesthetic medicines.  Any blood disorders you have.  Any surgeries you have had.  Any medical conditions you have.  Whether you are pregnant or may be pregnant. What are the risks? Generally, this is a safe procedure. However, problems may occur, including:  Harm to a pregnant woman and her unborn baby. This test involves the use of radiation. Radiation exposure can be dangerous to a pregnant woman and her unborn baby. If you are pregnant, you generally should not have this procedure done.  Slight increase in the risk of cancer. This is because of the radiation involved in the test. What happens before the procedure? No preparation is needed for this procedure. What happens during the procedure?  You will undress and remove any jewelry around your neck or chest.  You will put on a hospital gown.  Sticky electrodes will be placed on your chest. The electrodes  will be connected to an electrocardiogram (ECG) machine to record a tracing of the electrical activity of your heart.  A CT scanner will take pictures of your heart. During this time, you will be asked to lie still and hold your breath for 2-3 seconds while a picture of your heart is being taken. The procedure may vary among health care providers and hospitals. What happens after the procedure?  You can get dressed.  You can return to your normal activities.  It is up to you to get the results of your test. Ask your health care provider, or the department that is doing the test, when your results will be ready. Summary  A coronary calcium scan is an imaging test used to look for deposits of calcium and other fatty materials (plaques) in the inner lining of the blood vessels of the heart (coronary arteries).  Generally, this is a safe procedure. Tell your health care provider if you are pregnant or may be pregnant.  No preparation is needed for this procedure.  A CT scanner will take pictures of your heart.  You can return to your normal activities after the scan is done. This information is not intended to replace advice given to you by your health care provider. Make sure you discuss any questions you have with your health care provider. Document Released: 09/05/2007 Document Revised: 01/27/2016 Document Reviewed: 01/27/2016 Elsevier Interactive Patient Education  2017 [place]: At [facility], you and your health needs are our priority.  As part of our continuing mission  to provide you with exceptional heart care, we have created designated Provider Care Teams.  These Care Teams include your primary Cardiologist (physician) and Advanced Practice Providers (APPs -  Physician Assistants and Nurse Practitioners) who all work together to provide you with the care you need, when you need it.     Your next appointment:   4 week(s)  The format for your next  appointment:   Virtual Visit   Provider:   Glenetta Hew, MD   Other Instructions N/A

## 2019-07-04 NOTE — Progress Notes (Signed)
Primary Care Provider: Glenis Smoker, MD Cardiologist: Glenetta Hew, MD Electrophysiologist: None  Clinic Note: Chief Complaint  Patient presents with  . Follow-up    Has quit smoking for over 1 year.  Now off the cigarettes  . Coronary Artery Disease    Noted on CT scan.  No angina    HPI:    Laura Hayes is a 64 y.o. female with a PMH notable for hypertension and hyperlipidemia as well as long-term smoking with CORONARY CALCIFICATION on CT Scan (and family history of CAD) who presents today for cardiology follow-up at the request of Sela Hilding, MD -> in response to follow-up CT scan.  AVEAH COURTOIS was last seen in Aug 2018 to follow-up for coronary calcium score and CTA (reviewed below in PMH) -> coronary CTA showed mild elevation of coronary calcium, but no significant obstructive disease.  Was on good medications.  No changes made. ->  Recommended smoking cessation counseling and exercise.  Recent Hospitalizations: none  Reviewed  CV studies:    The following studies were reviewed today: (if available, images/films reviewed: From Epic Chart or Care Everywhere) Sept 2018 . AAADuplex : + atherosclerosis, no stenosis or AAA.   Carotid dopplers - normal  Screening chest CT October 2020 -multivessel coronary atherosclerosis noted with aortic valve calcification.  Lung-RADS 2, also noted moderate bullous emphysema.  Stable lung nodules.  Esophageal air-fluid level suggesting GERD.  Interval History:   Laura Hayes returns today presumably because her follow-up chest CT showed "multivessel "CAD.  She indicated to her PCP that this has been evaluated, but recommendation was to follow back up with cardiology.  She has not had any cardiac symptoms.  She is brought to amounts that she actually quit smoking about a year ago and is now finally off of the cigarettes for the last month or so.  She is doing great with that.  Her breathing is improved.  She  is not having any resting or exertional chest pain or pressure.  No significant exertional dyspnea.  She is not really exercising routinely, but is staying active.  Cardiovascular Review of Symptoms (Summary) : no chest pain or dyspnea on exertion negative for - edema, irregular heartbeat, orthopnea, palpitations, paroxysmal nocturnal dyspnea, rapid heart rate, shortness of breath or Syncope/near syncope, TIA/amaurosis fugax, claudication  The patient does not have symptoms concerning for COVID-19 infection (fever, chills, cough, or new shortness of breath).  The patient is practicing social distancing & Masking.    REVIEWED OF SYSTEMS   Review of Systems  Constitutional: Negative for malaise/fatigue and weight loss.  HENT: Negative for congestion and nosebleeds.   Respiratory: Negative for cough (Less prominent smoker's cough since she is 1 year out from smoking) and shortness of breath.   Gastrointestinal: Negative for abdominal pain, blood in stool and melena.  Genitourinary: Negative for hematuria.  Musculoskeletal: Positive for back pain (Chronic-limits her ability to exercise).  Neurological: Negative for dizziness, focal weakness and weakness.  Psychiatric/Behavioral: Negative for memory loss. The patient is not nervous/anxious (Well-controlled) and does not have insomnia.    I have reviewed and (if needed) personally updated the patient's problem list, medications, allergies, past medical and surgical history, social and family history.   PAST MEDICAL HISTORY   Past Medical History:  Diagnosis Date  . Aortic calcification (HCC)   . Cluster headache   . Colon polyps   . COPD (chronic obstructive pulmonary disease) ([place])   . Coronary artery calcification seen  on CAT scan 09/2016   Coronary calcium score 172.  Mid LAD <50% & mid RCA 30% calcified lesions.  Nonflow limiting.  . Depression   . Essential hypertension   . GERD (gastroesophageal reflux disease)   .  Hypercholesteremia   . Vitamin D deficiency     PAST SURGICAL HISTORY   Past Surgical History:  Procedure Laterality Date  . CESAREAN SECTION    . HAND SURGERY Right      MEDICATIONS/ALLERGIES   Current Meds  Medication Sig  . acetaminophen (TYLENOL) 325 MG tablet Take 650 mg by mouth every 6 (six) hours as needed.  Marland Kitchen aspirin 81 MG EC tablet Take 81 mg by mouth daily.  Marland Kitchen buPROPion (WELLBUTRIN XL) 300 MG 24 hr tablet Take 300 mg by mouth daily.  . calcium carbonate (TUMS - DOSED IN MG ELEMENTAL CALCIUM) 500 MG chewable tablet Chew 1 tablet by mouth daily as needed for indigestion or heartburn.  . Cholecalciferol (VITAMIN D3) 50 MCG (2000 UT) TABS Take by mouth.  Marland Kitchen ibuprofen (ADVIL,MOTRIN) 200 MG tablet Take 200 mg by mouth every 6 (six) hours as needed.  Marland Kitchen losartan (COZAAR) 100 MG tablet Take 1 tablet by mouth daily.  . rosuvastatin (CRESTOR) 20 MG tablet Take 20 mg by mouth daily.  . sertraline (ZOLOFT) 100 MG tablet Take 100 mg by mouth daily.  . SUMAtriptan (IMITREX) 50 MG tablet Take 1 tablet (50 mg total) by mouth every 2 (two) hours as needed for migraine. May repeat in 2 hours if headache persists or recurs.  . verapamil (CALAN-SR) 180 MG CR tablet   . [DISCONTINUED] ergocalciferol (VITAMIN D2) 50000 UNITS capsule Take 50,000 Units by mouth once a week.    Allergies  Allergen Reactions  . Lisinopril Cough    SOCIAL HISTORY/FAMILY HISTORY   Reviewed in Epic:  Pertinent findings: She quit smoking over a year ago, has now fully weaned off of electronic cigarettes as well.  OBJCTIVE -PE, EKG, labs   Wt Readings from Last 3 Encounters:  07/04/19 128 lb 6.4 oz (58.2 kg)  11/13/16 119 lb 6.4 oz (54.2 kg)  09/03/16 122 lb (55.3 kg)    Physical Exam: BP 110/84   Pulse 60   Ht 5\' 3"  (1.6 m)   Wt 128 lb 6.4 oz (58.2 kg)   SpO2 97%   BMI 22.75 kg/m  Physical Exam  Constitutional: She is oriented to person, place, and time. She appears well-developed and  well-nourished. No distress.  Healthy-appearing.  Well-groomed.  HENT:  Head: Normocephalic and atraumatic.  Neck: No hepatojugular reflux and no JVD present. Carotid bruit is not present.  Cardiovascular: Normal rate, regular rhythm, normal heart sounds and intact distal pulses.  No extrasystoles are present. PMI is not displaced. Exam reveals no gallop and no friction rub.  No murmur heard. Pulmonary/Chest: Effort normal. No respiratory distress. She exhibits no tenderness.  Mild interstitial sounds, but no wheezes rales or rhonchi  Musculoskeletal:        General: No edema. Normal range of motion.     Cervical back: Normal range of motion and neck supple.  Neurological: She is alert and oriented to person, place, and time.  Psychiatric: She has a normal mood and affect. Her behavior is normal. Judgment and thought content normal.  Vitals reviewed.    Adult ECG Report  Rate: 60;  Rhythm: normal sinus rhythm and Cannot exclude septal MI, age-indeterminate.  Otherwise normal axis, intervals and durations.;   Narrative Interpretation: Stable EKG.  Recent Labs: December 12, 2018: TC 174, TG 73, HDL 80, LDL 79. Cr 0.7.  K+ 4.9. No results found for: CHOL, HDL, LDLCALC, LDLDIRECT, TRIG, CHOLHDL Lab Results  Component Value Date   CREATININE 0.70 09/30/2016   No results found for: TSH  ASSESSMENT/PLAN    Problem List Items Addressed This Visit    Essential hypertension (Chronic)    Blood pressure well controlled on current meds.  No change. In the absence of any CHF symptoms, verapamil is reasonable as a substitute for beta-blocker given her bullous emphysema on chest CT      Relevant Medications   aspirin 81 MG EC tablet   Hyperlipidemia with target LDL less than 70 (Chronic)    Target LDL should be less than 100 but if possible less than 70 given her family history of CAD and coronary calcium score. -> Labs been followed by PCP.  Reasonably controlled as of last September.   Low threshold to consider up titration of Crestor up to 40 mg daily.      Relevant Medications   aspirin 81 MG EC tablet   Other Relevant Orders   CT CARDIAC SCORING (Completed)   EKG 12-Lead (Completed)   Coronary artery calcification seen on CAT scan - Primary (Chronic)    Mild to moderate disease noted on coronary CTA.  No active anginal symptoms.  Continue aggressive risk factor modification.  BP well controlled on verapamil plus losartan. Has successfully quit smoking -> congratulated on her efforts. On stable dose of rosuvastatin.  Recent labs show relatively good control.  Not unreasonable to reevaluate with a coronary calcium score just to determine any progression of disease.  Plan:     Reevaluate with CORONARY CALCIUM SCORE - in the absence of symptoms, I do not think ischemic evaluation is necessary.  Continue current management with close monitoring of her lipids.  At this level of calcification, there is no true indication for aspirin however not unreasonable with her family history to take 81 mg aspirin.        Relevant Medications   aspirin 81 MG EC tablet   Other Relevant Orders   CT CARDIAC SCORING (Completed)   EKG 12-Lead (Completed)   Family history of premature CAD (Chronic)    Nonobstructive CAD noted on coronary CTA 3 years ago.  I do not see any reason to reevaluate with a full coronary CTA in the absence of symptoms.  Rechecking coronary calcium score to see if it stable. She is on a pretty good regimen at this time.  May need a little more close control of lipids but otherwise doing well.      Aortic calcification (HCC) (Chronic)    Similar risk factor modification with blood pressure and lipid control.  Smoking cessation is a big step. No evidence of aortic aneurysm.      Relevant Medications   aspirin 81 MG EC tablet   Other Relevant Orders   CT CARDIAC SCORING (Completed)   EKG 12-Lead (Completed)   Aortic valve calcification (Chronic)     Seen on CT scan, but no murmur on exam.  Without a murmur, no reason to check 2D echo.      Relevant Medications   aspirin 81 MG EC tablet      COVID-19 Education: The signs and symptoms of COVID-19 were discussed with the patient and how to seek care for testing (follow up with PCP or arrange E-visit).   The importance of social distancing was discussed today.  I spent a total of 16 minutes with the patient. >  50% of the time was spent in direct patient consultation.  Additional time spent with chart review  / charting (studies, outside notes, etc): 8 Total Time: 24 min   Current medicines are reviewed at length with the patient today.  (+/- concerns) n/a  Notice: This dictation was prepared with Dragon dictation along with smaller phrase technology. Any transcriptional errors that result from this process are unintentional and may not be corrected upon review.  Patient Instructions / Medication Changes & Studies & Tests Ordered   Patient Instructions  Medication Instructions:  No changes *If you need a refill on your cardiac medications before your next appointment, please call your pharmacy*   Lab Work: Not needed    Testing/Procedures: Will be schedule at [facility]  [address] CT coronary calcium score. This test is done at [address]. This is $150 out of pocket.   Coronary CalciumScan A coronary calcium scan is an imaging test used to look for deposits of calcium and other fatty materials (plaques) in the inner lining of the blood vessels of the heart (coronary arteries). These deposits of calcium and plaques can partly clog and narrow the coronary arteries without producing any symptoms or warning signs. This puts a person at risk for a heart attack. This test can detect these deposits before symptoms develop. Tell a health care provider about:  Any allergies you have.  All medicines you are taking, including vitamins, herbs, eye  drops, creams, and over-the-counter medicines.  Any problems you or family members have had with anesthetic medicines.  Any blood disorders you have.  Any surgeries you have had.  Any medical conditions you have.  Whether you are pregnant or may be pregnant. What are the risks? Generally, this is a safe procedure. However, problems may occur, including:  Harm to a pregnant woman and her unborn baby. This test involves the use of radiation. Radiation exposure can be dangerous to a pregnant woman and her unborn baby. If you are pregnant, you generally should not have this procedure done.  Slight increase in the risk of cancer. This is because of the radiation involved in the test. What happens before the procedure? No preparation is needed for this procedure. What happens during the procedure?  You will undress and remove any jewelry around your neck or chest.  You will put on a hospital gown.  Sticky electrodes will be placed on your chest. The electrodes will be connected to an electrocardiogram (ECG) machine to record a tracing of the electrical activity of your heart.  A CT scanner will take pictures of your heart. During this time, you will be asked to lie still and hold your breath for 2-3 seconds while a picture of your heart is being taken. The procedure may vary among health care providers and hospitals. What happens after the procedure?  You can get dressed.  You can return to your normal activities.  It is up to you to get the results of your test. Ask your health care provider, or the department that is doing the test, when your results will be ready. Summary  A coronary calcium scan is an imaging test used to look for deposits of calcium and other fatty materials (plaques) in the inner lining of the blood vessels of the heart (coronary arteries).  Generally, this is a safe procedure. Tell your health care provider if you are pregnant  or may be pregnant.  No  preparation is needed for this procedure.  A CT scanner will take pictures of your heart.  You can return to your normal activities after the scan is done. This information is not intended to replace advice given to you by your health care provider. Make sure you discuss any questions you have with your health care provider. Document Released: 09/05/2007 Document Revised: 01/27/2016 Document Reviewed: 01/27/2016 Elsevier Interactive Patient Education  2017 [place]: At [facility], you and your health needs are our priority.  As part of our continuing mission to provide you with exceptional heart care, we have created designated Provider Care Teams.  These Care Teams include your primary Cardiologist (physician) and Advanced Practice Providers (APPs -  Physician Assistants and Nurse Practitioners) who all work together to provide you with the care you need, when you need it.     Your next appointment:   4 week(s)  The format for your next appointment:   Virtual Visit   Provider:   Glenetta Hew, MD   Other Instructions N/A    Studies Ordered:   Orders Placed This Encounter  Procedures  . CT CARDIAC SCORING  . EKG 12-Lead     Glenetta Hew, M.D., M.S. Interventional Cardiologist   Pager # [phone] Phone # [phone] [address] [place], [place] [address]   Thank you for choosing [facility] at [facility]!!

## 2019-07-06 ENCOUNTER — Other Ambulatory Visit: Payer: Self-pay

## 2019-07-06 ENCOUNTER — Ambulatory Visit (INDEPENDENT_AMBULATORY_CARE_PROVIDER_SITE_OTHER)
Admission: RE | Admit: 2019-07-06 | Discharge: 2019-07-06 | Disposition: A | Discharge status: Home / Self Care | Payer: Self-pay | Source: Ambulatory Visit | Attending: Cardiology | Admitting: Cardiology

## 2019-07-06 DIAGNOSIS — I700 Atherosclerosis of aorta: Secondary | ICD-10-CM

## 2019-07-06 DIAGNOSIS — E785 Hyperlipidemia, unspecified: Secondary | ICD-10-CM

## 2019-07-06 DIAGNOSIS — I2510 Atherosclerotic heart disease of native coronary artery without angina pectoris: Secondary | ICD-10-CM

## 2019-07-08 ENCOUNTER — Encounter: Payer: Self-pay | Admitting: Cardiology

## 2019-07-08 DIAGNOSIS — I359 Nonrheumatic aortic valve disorder, unspecified: Secondary | ICD-10-CM | POA: Insufficient documentation

## 2019-07-08 NOTE — Assessment & Plan Note (Signed)
Target LDL should be less than 100 but if possible less than 70 given her family history of CAD and coronary calcium score. -> Labs been followed by PCP.  Reasonably controlled as of last September.  Low threshold to consider up titration of Crestor up to 40 mg daily.

## 2019-07-08 NOTE — Assessment & Plan Note (Signed)
Blood pressure well controlled on current meds.  No change. In the absence of any CHF symptoms, verapamil is reasonable as a substitute for beta-blocker given her bullous emphysema on chest CT

## 2019-07-08 NOTE — Assessment & Plan Note (Addendum)
Mild to moderate disease noted on coronary CTA.  No active anginal symptoms.  Continue aggressive risk factor modification.  BP well controlled on verapamil plus losartan. Has successfully quit smoking -> congratulated on her efforts. On stable dose of rosuvastatin.  Recent labs show relatively good control.  Not unreasonable to reevaluate with a coronary calcium score just to determine any progression of disease.  Plan:     Reevaluate with CORONARY CALCIUM SCORE - in the absence of symptoms, I do not think ischemic evaluation is necessary.  Continue current management with close monitoring of her lipids.  At this level of calcification, there is no true indication for aspirin however not unreasonable with her family history to take 81 mg aspirin.

## 2019-07-08 NOTE — Assessment & Plan Note (Signed)
Nonobstructive CAD noted on coronary CTA 3 years ago.  I do not see any reason to reevaluate with a full coronary CTA in the absence of symptoms.  Rechecking coronary calcium score to see if it stable. She is on a pretty good regimen at this time.  May need a little more close control of lipids but otherwise doing well.

## 2019-07-08 NOTE — Assessment & Plan Note (Signed)
Similar risk factor modification with blood pressure and lipid control.  Smoking cessation is a big step. No evidence of aortic aneurysm.

## 2019-07-08 NOTE — Assessment & Plan Note (Signed)
Seen on CT scan, but no murmur on exam.  Without a murmur, no reason to check 2D echo.

## 2019-08-01 ENCOUNTER — Ambulatory Visit: Payer: Medicare Other | Admitting: Neurology

## 2019-08-01 ENCOUNTER — Other Ambulatory Visit: Payer: Self-pay

## 2019-08-01 ENCOUNTER — Encounter: Payer: Self-pay | Admitting: Neurology

## 2019-08-01 VITALS — BP 146/86 | HR 63 | Ht 63.0 in | Wt 131.0 lb

## 2019-08-01 DIAGNOSIS — R519 Headache, unspecified: Secondary | ICD-10-CM | POA: Insufficient documentation

## 2019-08-01 DIAGNOSIS — M542 Cervicalgia: Secondary | ICD-10-CM | POA: Diagnosis not present

## 2019-08-01 NOTE — Progress Notes (Signed)
PATIENT: Laura Hayes DOB: 01/02/1956  Chief Complaint  Patient presents with  . New Patient (Initial Visit)    PCP/Referring: Dr. Lindell Noe. Vision: 20/30 with correction bilaterally.  . Pain    Complaining of head pain on the left side around her ear. Started in September of 2019. Describes it as an uncomfortable feeling.     HISTORICAL  JERSIE BEEL is a 64 year old female, seen in request by primary care physician Dr. Glenis Smoker for evaluation of frequent headaches, initial evaluation was on Aug 01, 2019.  I have reviewed and summarized the referring note from the referring physician.  She had a past medical history of hypertension, hyperlipidemia, depression anxiety, has been on stable dose of Wellbutrin, Zoloft for many years, she was a longtime smoker 1 pack a day for 40 years, finally quit around 2018.  She reported long history of chronic migraine headache, her typical migraine are lateralized severe pounding headache, with associated light noise sensitivity, nauseous, worsening by movement, lasting for few hours, improved by sleep, usually triggered by missing her meals.  She reported significant improvement of her migraine headaches since she stopped smoking in 2018, she began to develop different headache since September 2019, she had a sudden onset left parietal area pressure pain 1 night while relaxing, there was no focal signs, she took few tablets of ibuprofen, went to sleep, next day, the moderate to severe pressure headache is much improved, but since then, she continued to experience intermittent left around the ear, occipital parietal area pressure pain, sometimes felt the skin was hypersensitive to touch, she denies rash broke out, denies hearing loss, occasionally left neck pain radiating pain to left shoulder, left deltoid region, she denies diffuse body achy pain, chewing difficulty, visual loss  Laboratory evaluation in September 2020: Normal  BMP, creatinine of 0.87, lipid panel, LDL of 79   REVIEW OF SYSTEMS: Full 14 system review of systems performed and notable only for as above All other review of systems were negative.  ALLERGIES: Allergies  Allergen Reactions  . Lisinopril Cough    HOME MEDICATIONS: Current Outpatient Medications  Medication Sig Dispense Refill  . acetaminophen (TYLENOL) 325 MG tablet Take 650 mg by mouth every 6 (six) hours as needed.    Marland Kitchen aspirin 81 MG EC tablet Take 81 mg by mouth daily.    Marland Kitchen buPROPion (WELLBUTRIN XL) 300 MG 24 hr tablet Take 300 mg by mouth daily.    . calcium carbonate (TUMS - DOSED IN MG ELEMENTAL CALCIUM) 500 MG chewable tablet Chew 1 tablet by mouth daily as needed for indigestion or heartburn.    . Cholecalciferol (VITAMIN D3) 50 MCG (2000 UT) TABS Take by mouth.    Marland Kitchen ibuprofen (ADVIL,MOTRIN) 200 MG tablet Take 200 mg by mouth every 6 (six) hours as needed.    Marland Kitchen losartan (COZAAR) 100 MG tablet Take 1 tablet by mouth daily.    . rosuvastatin (CRESTOR) 20 MG tablet Take 20 mg by mouth daily.    . sertraline (ZOLOFT) 100 MG tablet Take 100 mg by mouth daily.    . SUMAtriptan (IMITREX) 50 MG tablet Take 1 tablet (50 mg total) by mouth every 2 (two) hours as needed for migraine. May repeat in 2 hours if headache persists or recurs. 10 tablet 6  . verapamil (CALAN-SR) 180 MG CR tablet   0   No current facility-administered medications for this visit.    PAST MEDICAL HISTORY: Past Medical History:  Diagnosis Date  .  Aortic calcification (HCC)   . Cluster headache   . Colon polyps   . COPD (chronic obstructive pulmonary disease) ([place])   . Coronary artery calcification seen on CAT scan 09/2016   Coronary calcium score 172.  Mid LAD <50% & mid RCA 30% calcified lesions.  Nonflow limiting.  . Depression   . Essential hypertension   . GERD (gastroesophageal reflux disease)   . Hypercholesteremia   . Vitamin D deficiency     PAST SURGICAL HISTORY: Past Surgical History:    Procedure Laterality Date  . CESAREAN SECTION    . HAND SURGERY Right     FAMILY HISTORY: Family History  Problem Relation Age of Onset  . Migraines Mother   . Transient ischemic attack Mother   . Dementia Mother   . Hypertension Mother   . Hyperlipidemia Mother   . Lung disease Father   . Hypertension Father   . Emphysema Father   . COPD Father   . Coronary artery disease Father        s/p PCI, CABG  . Other Son        Tricuspid atresa with VSD  . Hypertension Brother   . Stroke Maternal Grandmother   . Heart attack Maternal Grandfather   . Heart attack Paternal Grandfather   . Heart attack Brother   . Stroke Brother   . Breast cancer Neg Hx     SOCIAL HISTORY: Social History   Socioeconomic History  . Marital status: Married    Spouse name: Not on file  . Number of children: 1  . Years of education: 32  . Highest education level: Not on file  Occupational History  . Occupation: Photographer  Tobacco Use  . Smoking status: Former Smoker    Types: E-cigarettes    Quit date: 03/06/2015    Years since quitting: 4.4  . Smokeless tobacco: Never Used  Substance and Sexual Activity  . Alcohol use: Yes    Alcohol/week: 1.0 standard drinks    Types: 1 Standard drinks or equivalent per week    Comment: Seldom - social  . Drug use: No    Comment: past hx of marijuana use in 70's  . Sexual activity: Yes  Other Topics Concern  . Not on file  Social History Narrative   Lives at home with husband.   Had 3 total children, 2 living.  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   Right-handed.   2 liter caffeine daily (diet soda).   Social Determinants of Health   Financial Resource Strain:   . Difficulty of Paying Living Expenses:   Food Insecurity:   . Worried About Charity fundraiser in the Last Year:   . Arboriculturist in the Last Year:   Transportation Needs:   . Film/video editor (Medical):   Marland Kitchen Lack of  Transportation (Non-Medical):   Physical Activity:   . Days of Exercise per Week:   . Minutes of Exercise per Session:   Stress:   . Feeling of Stress :   Social Connections:   . Frequency of Communication with Friends and Family:   . Frequency of Social Gatherings with Friends and Family:   . Attends Religious Services:   . Active Member of Clubs or Organizations:   . Attends Archivist Meetings:   Marland Kitchen Marital Status:   Intimate Partner Violence:   . Fear of Current or Ex-Partner:   . Emotionally Abused:   .  Physically Abused:   . Sexually Abused:      PHYSICAL EXAM   Vitals:   08/01/19 1451  BP: (!) 146/86  Pulse: 63  Weight: 131 lb (59.4 kg)  Height: 5' 3"  (1.6 m)    Not recorded      Body mass index is 23.21 kg/m.  PHYSICAL EXAMNIATION:  Gen: NAD, conversant, well nourised, well groomed                     Cardiovascular: Regular rate rhythm, no peripheral edema, warm, nontender. Eyes: Conjunctivae clear without exudates or hemorrhage Neck: Supple, no carotid bruits. Pulmonary: Clear to auscultation bilaterally   NEUROLOGICAL EXAM:  MENTAL STATUS: Speech:    Speech is normal; fluent and spontaneous with normal comprehension.  Cognition:     Orientation to time, place and person     Normal recent and remote memory     Normal Attention span and concentration     Normal Language, naming, repeating,spontaneous speech     Fund of knowledge   CRANIAL NERVES: CN II: Visual fields are full to confrontation. Pupils are round equal and briskly reactive to light. CN III, IV, VI: extraocular movement are normal. No ptosis. CN V: Facial sensation is intact to light touch CN VII: Face is symmetric with normal eye closure  CN VIII: Hearing is normal to causal conversation. CN IX, X: Phonation is normal. CN XI: Head turning and shoulder shrug are intact  MOTOR: There is no pronator drift of out-stretched arms. Muscle bulk and tone are normal. Muscle  strength is normal.  REFLEXES: Reflexes are 2+ and symmetric at the biceps, triceps, knees, and ankles. Plantar responses are flexor.  SENSORY: Intact to light touch, pinprick and vibratory sensation are intact in fingers and toes.  COORDINATION: There is no trunk or limb dysmetria noted.  GAIT/STANCE: Posture is normal. Gait is steady with normal steps, base, arm swing, and turning. Heel and toe walking are normal. Tandem gait is normal.  Romberg is absent.   DIAGNOSTIC DATA (LABS, IMAGING, TESTING) - I reviewed patient records, labs, notes, testing and imaging myself where available.   ASSESSMENT AND PLAN  KEYASIA JOLLIFF is a 64 y.o. female   Left neck pain, radiating pain to left shoulder  MRI of cervical spine to rule out left cervical radiculopathy Left occipital area headache, radiating discomfort to left temporoparietal region, left side headaches  Need to rule out central nervous system structural lesion, with her new onset persistent left-sided headaches,  ESR C-reactive protein to rule out temporal arteritis  I have suggested neck stretching exercise, heating pad, as needed NSAIDs     Marcial Pacas, M.D. Ph.D.  [facility] [address] [place], [place] [address] Ph: [phone] Fax: [phone]  CC: Glenis Smoker, MD

## 2019-08-02 ENCOUNTER — Telehealth: Payer: Self-pay | Admitting: Neurology

## 2019-08-02 LAB — SEDIMENTATION RATE: Sed Rate: 19 mm/hr (ref 0–40)

## 2019-08-02 LAB — C-REACTIVE PROTEIN: CRP: 1 mg/L (ref 0–10)

## 2019-08-02 NOTE — Telephone Encounter (Signed)
[employer] medicare order sent to [facility]. No auth they will reach out to the patient to schedule.  

## 2019-08-07 ENCOUNTER — Telehealth (INDEPENDENT_AMBULATORY_CARE_PROVIDER_SITE_OTHER): Payer: Medicare Other | Admitting: Cardiology

## 2019-08-07 ENCOUNTER — Telehealth: Payer: Self-pay | Admitting: *Deleted

## 2019-08-07 ENCOUNTER — Encounter: Payer: Self-pay | Admitting: Cardiology

## 2019-08-07 VITALS — BP 148/87 | HR 67 | Ht 63.0 in

## 2019-08-07 DIAGNOSIS — I10 Essential (primary) hypertension: Secondary | ICD-10-CM

## 2019-08-07 DIAGNOSIS — I2510 Atherosclerotic heart disease of native coronary artery without angina pectoris: Secondary | ICD-10-CM

## 2019-08-07 DIAGNOSIS — I700 Atherosclerosis of aorta: Secondary | ICD-10-CM | POA: Diagnosis not present

## 2019-08-07 DIAGNOSIS — Z8249 Family history of ischemic heart disease and other diseases of the circulatory system: Secondary | ICD-10-CM

## 2019-08-07 DIAGNOSIS — I359 Nonrheumatic aortic valve disorder, unspecified: Secondary | ICD-10-CM

## 2019-08-07 DIAGNOSIS — E785 Hyperlipidemia, unspecified: Secondary | ICD-10-CM

## 2019-08-07 NOTE — Assessment & Plan Note (Signed)
Blood pressure is little high today.  She is on verapamil because history of tachycardia in the past and that seems be doing pretty well.  I would have her take that at nighttime and continue taking her losartan in the morning.  This would allow [place] to potentially add ARB to the losartan if necessary for more blood pressure control.  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

## 2019-08-07 NOTE — Telephone Encounter (Signed)
RN spoke to patient. Instruction were given  from today's virtual visit 08/07/19 .  AVS SUMMARY has been sent by mychart .   Patient verbalized understanding

## 2019-08-07 NOTE — Assessment & Plan Note (Signed)
Her last LDL look pretty good at 78.  Due to have labs checked soon.  May want to consider up titration of Crestor to 40 mg versus adding Zetia.  Continue to monitor.  Labs being checked by PCP in June-July.

## 2019-08-07 NOTE — Assessment & Plan Note (Signed)
No murmur on exam.  I doubt that there is any significant aortic stenosis.  Continue to monitor exam.

## 2019-08-07 NOTE — Telephone Encounter (Signed)
  Patient Consent for Virtual Visit         Laura Hayes has provided verbal consent on 08/07/2019 for a virtual visit (video or telephone).   CONSENT FOR VIRTUAL VISIT FOR:  Laura Hayes  By participating in this virtual visit I agree to the following:  I hereby voluntarily request, consent and authorize [facility] and its employed or contracted physicians, physician assistants, nurse practitioners or other licensed health care professionals (the Practitioner), to provide me with telemedicine health care services (the "Services") as deemed necessary by the treating Practitioner. I acknowledge and consent to receive the Services by the Practitioner via telemedicine. I understand that the telemedicine visit will involve communicating with the Practitioner through live audiovisual communication technology and the disclosure of certain medical information by electronic transmission. I acknowledge that I have been given the opportunity to request an in-person assessment or other available alternative prior to the telemedicine visit and am voluntarily participating in the telemedicine visit.  I understand that I have the right to withhold or withdraw my consent to the use of telemedicine in the course of my care at any time, without affecting my right to future care or treatment, and that the Practitioner or I may terminate the telemedicine visit at any time. I understand that I have the right to inspect all information obtained and/or recorded in the course of the telemedicine visit and may receive copies of available information for a reasonable fee.  I understand that some of the potential risks of receiving the Services via telemedicine include:  Marland Kitchen Delay or interruption in medical evaluation due to technological equipment failure or disruption; . Information transmitted may not be sufficient (e.g. poor resolution of images) to allow for appropriate medical decision making by the  Practitioner; and/or  . In rare instances, security protocols could fail, causing a breach of personal health information.  Furthermore, I acknowledge that it is my responsibility to provide information about my medical history, conditions and care that is complete and accurate to the best of my ability. I acknowledge that Practitioner's advice, recommendations, and/or decision may be based on factors not within their control, such as incomplete or inaccurate data provided by me or distortions of diagnostic images or specimens that may result from electronic transmissions. I understand that the practice of medicine is not an exact science and that Practitioner makes no warranties or guarantees regarding treatment outcomes. I acknowledge that a copy of this consent can be made available to me via my patient portal ([facility]), or I can request a printed copy by calling the office of [facility].    I understand that my insurance will be billed for this visit.   I have read or had this consent read to me. . I understand the contents of this consent, which adequately explains the benefits and risks of the Services being provided via telemedicine.  . I have been provided ample opportunity to ask questions regarding this consent and the Services and have had my questions answered to my satisfaction. . I give my informed consent for the services to be provided through the use of telemedicine in my medical care

## 2019-08-07 NOTE — Assessment & Plan Note (Signed)
Coronary calcium score is only slightly higher than it had been.  This would show that there is been minimal progression in the last 3 years.  Plan will be to continue aggressive management of risk factors including hypertension hyperlipidemia.  Again may need to increase rosuvastatin.  May need to add HCTZ for better blood pressure control.  She is taking an aspirin based on her family history of CAD.

## 2019-08-07 NOTE — Progress Notes (Signed)
Virtual Visit via Telephone Note   This visit type was conducted due to national recommendations for restrictions regarding the COVID-19 Pandemic (e.g. social distancing) in an effort to limit this patient's exposure and mitigate transmission in our community.  Due to her co-morbid illnesses, this patient is at least at moderate risk for complications without adequate follow up.  This format is felt to be most appropriate for this patient at this time.  The patient did not have access to video technology/had technical difficulties with video requiring transitioning to audio format only (telephone).  All issues noted in this document were discussed and addressed.  No physical exam could be performed with this format.  Please refer to the patient's chart for her  consent to telehealth for [facility].   Patient has given verbal permission to conduct this visit via virtual appointment and to bill insurance 08/07/2019 1:52 PM     Evaluation Performed:  Follow-up visit  Date:  08/07/2019   ID:  Laura Hayes, DOB 10/01/1955, MRN [id number]  Patient Location: Home Provider Location: Home  PCP:  Glenis Smoker, MD  Cardiologist:  Glenetta Hew, MD  Electrophysiologist:  None   Chief Complaint:   Chief Complaint  Patient presents with  . Follow-up    Test results  . Coronary Artery Disease    Coronary calcium score results.  No symptoms.     History of Present Illness:    Laura Hayes is a 64 y.o. female with PMH notable for hypertension and hyperlipidemia as well as coronary calcification seen on CT scan who presents via audio/video conferencing for a telehealth visit today to discuss the results of coronary calcium score.  Laura Hayes was last seen on April 17 for delayed follow-up..  CT scan showed multivessel CAD and PCP recommended follow-up with cardiology.  No cardiac symptoms to speak of. Has quit smoking for over 1 year.  Now off the cigarettes-noting  dyspnea improved.  No chest pain or pressure.  Hospitalizations:  . None   Recent - Interim CV studies:   The following studies were reviewed today: . Coronary calcium score: Increased from 172 up to 239.  Also noted scattered aortic valve and ascending aortic calcification.  Mild progression of disease.  Need to be more aggressive with lipid management.  Inerval History   Laura Hayes is following up via telemedicine visit to discuss results of her coronary calcium score.  She remains totally asymptomatic from a cardiac standpoint.  She is doing well with her smoking cessation.  Not noting any exertional dyspnea or chest pain.  No heart failure symptoms of PND, orthopnea or edema.  She is making a concerted effort to increase her level of exercise. She is little concerned about her blood pressure being elevated today, but usually is not that high.  No headaches or blurred vision.  Cardiovascular ROS: no chest pain or dyspnea on exertion positive for - Some somewhat elevated blood pressure readings at home negative for - edema, irregular heartbeat, orthopnea, palpitations, paroxysmal nocturnal dyspnea, rapid heart rate, shortness of breath or Syncope/near syncope, TIA/amaurosis fugax, claudication   ROS:  Please see the history of present illness.    The patient does not have symptoms concerning for COVID-19 infection (fever, chills, cough, or new shortness of breath).  Review of Systems  Constitutional: Negative for malaise/fatigue and weight loss.  HENT: Negative for congestion and nosebleeds.   Respiratory: Negative for cough and shortness of breath.   Gastrointestinal:  Negative for abdominal pain, blood in stool and melena.  Genitourinary: Negative for hematuria.  Musculoskeletal: Negative for joint pain.  Neurological: Negative for dizziness and focal weakness.    The patient is practicing social distancing. ++ Vaccine as of Feb.  Past Medical History:  Diagnosis Date   . Agatston coronary artery calcium score between 200 and 399 09/2016   Coronary calcium score 172.  Mid LAD <50% & mid RCA 30% calcified lesions.  Nonflow limiting.;;  April 2021-calcium score now increased to 2 and 39.  Scattered aortic valve and ascending aortic calcification noted.  . Aortic calcification ([place])   . Cluster headache   . Colon polyps   . COPD (chronic obstructive pulmonary disease) ([place])   . Depression   . Essential hypertension   . GERD (gastroesophageal reflux disease)   . Hypercholesteremia   . Vitamin D deficiency    Past Surgical History:  Procedure Laterality Date  . CESAREAN SECTION    . HAND SURGERY Right      Current Meds  Medication Sig  . acetaminophen (TYLENOL) 325 MG tablet Take 650 mg by mouth every 6 ([name]) hours as needed.  Marland Kitchen aspirin 81 MG EC tablet Take 81 mg by mouth daily.  Marland Kitchen buPROPion (WELLBUTRIN XL) 300 MG 24 hr tablet Take 300 mg by mouth daily.  . calcium carbonate (TUMS - DOSED IN MG ELEMENTAL CALCIUM) 500 MG chewable tablet Chew 1 tablet by mouth daily as needed for indigestion or heartburn.  . Cholecalciferol (VITAMIN D3) 50 MCG (2000 UT) TABS Take by mouth.  Marland Kitchen ibuprofen (ADVIL,MOTRIN) 200 MG tablet Take 200 mg by mouth every 6 ([name]) hours as needed.  Marland Kitchen losartan (COZAAR) 100 MG tablet Take 1 tablet by mouth daily.  . rosuvastatin (CRESTOR) 20 MG tablet Take 20 mg by mouth daily.  . sertraline (ZOLOFT) 100 MG tablet Take 100 mg by mouth daily.  . verapamil (CALAN-SR) 180 MG CR tablet      Allergies:   Lisinopril   Social History   Tobacco Use  . Smoking status: Former Smoker    Types: E-cigarettes    Quit date: 03/06/2015    Years since quitting: 4.4  . Smokeless tobacco: Never Used  Substance Use Topics  . Alcohol use: Yes    Alcohol/week: 1.0 standard drinks    Types: 1 Standard drinks or equivalent per week    Comment: Seldom - social  . Drug use: No    Comment: past hx of marijuana use in 4's     Family Hx: The  patient's family history includes COPD in her father; Coronary artery disease in her father; Dementia in her mother; Emphysema in her father; Heart attack in her brother, maternal grandfather, and paternal grandfather; Hyperlipidemia in her mother; Hypertension in her brother, father, and mother; Lung disease in her father; Migraines in her mother; Other in her son; Stroke in her brother and maternal grandmother; Transient ischemic attack in her mother. There is no history of Breast cancer.   Labs/Other Tests and Data Reviewed:    EKG:  No ECG reviewed.  Recent Labs: No results found for requested labs within last 8760 hours.   Recent Lipid Panel No results found for: CHOL, TRIG, HDL, CHOLHDL, LDLCALC, LDLDIRECT  Labs due in June - July;  Wt Readings from Last 3 Encounters:  08/01/19 131 lb (59.4 kg)  07/04/19 128 lb 6.4 oz (58.2 kg)  11/13/16 119 lb 6.4 oz (54.2 kg)     Objective:  Vital Signs:  BP (!) 148/87   Pulse 67   Ht 5\' 3"  (1.6 m)   BMI 23.21 kg/m  - BP is high for her. VITAL SIGNS:  reviewed Very Pleasant female in no acute distress. A&O x 3.  Normal Mood & Affect Non-labored respirations   ASSESSMENT & PLAN:    Problem List Items Addressed This Visit    Essential hypertension (Chronic)    Blood pressure is little high today.  She is on verapamil because history of tachycardia in the past and that seems be doing pretty well.  I would have her take that at nighttime and continue taking her losartan in the morning.  This would allow [place] to potentially add ARB to the losartan if necessary for more blood pressure control.  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  Due to have labs checked soon.  May want to consider up titration of Crestor to 40 mg versus adding Zetia.  Continue to monitor.  Labs being checked by PCP in June-July.      Coronary artery calcification  seen on CAT scan - Primary (Chronic)    Coronary calcium score is only slightly higher than it had been.  This would show that there is been minimal progression in the last 3 years.  Plan will be to continue aggressive management of risk factors including hypertension hyperlipidemia.  Again may need to increase rosuvastatin.  May need to add HCTZ for better blood pressure control.  She is taking an aspirin based on her family history of CAD.      Thoracic aortic atherosclerosis ([place]) (Chronic)    Noted back in 2018.  Carotid Dopplers abdominal aortic-iliac Dopplers did not show any signs of any significant stenosis.  Continue to treat cardiovascular is factors of hypertension, hyperlipidemia and glycemic control.  She has quit smoking.      Family history of premature CAD (Chronic)   Aortic valve calcification (Chronic)    No murmur on exam.  I doubt that there is any significant aortic stenosis.  Continue to monitor exam.         COVID-19 Education: The signs and symptoms of COVID-19 were discussed with the patient and how to seek care for testing (follow up with PCP or arrange E-visit).   The importance of social distancing was discussed today.  Time:   Today, I have spent 14 minutes with the patient with telehealth technology discussing the above problems.  4 minutes spent charting   Medication Adjustments/Labs and Tests Ordered: Current medicines are reviewed at length with the patient today.  Concerns regarding medicines are outlined above.   Patient Instructions  Medication Instructions:  Try taking Verapamil at night time  *If you need a refill on your cardiac medications before your next appointment, please call your pharmacy*   Lab Work:  Per PCP      Testing/Procedures: none   Follow-Up: At [facility], you and your health needs are our priority.  As part of our continuing mission to provide you with exceptional heart care, we have created designated  Provider Care Teams.  These Care Teams include your primary Cardiologist (physician) and Advanced Practice Providers (APPs -  Physician Assistants and Nurse Practitioners) who all work together to provide you with the care you need, when you need it.    Your next appointment:   1 year(s)  The format for your next appointment:   Either In Person or Virtual  Provider:   Glenetta Hew, MD   Other Instructions Keep staying active and exercising     Signed, Glenetta Hew, MD  08/07/2019 1:52 PM    [place]

## 2019-08-07 NOTE — Patient Instructions (Addendum)
Medication Instructions:  Try taking Verapamil at night time  *If you need a refill on your cardiac medications before your next appointment, please call your pharmacy*   Lab Work:  Per PCP      Testing/Procedures: none   Follow-Up: At [facility], you and your health needs are our priority.  As part of our continuing mission to provide you with exceptional heart care, we have created designated Provider Care Teams.  These Care Teams include your primary Cardiologist (physician) and Advanced Practice Providers (APPs -  Physician Assistants and Nurse Practitioners) who all work together to provide you with the care you need, when you need it.    Your next appointment:   1 year(s)  The format for your next appointment:   Either In Person or Virtual  Provider:   Glenetta Hew, MD   Other Instructions Keep staying active and exercising

## 2019-08-07 NOTE — Assessment & Plan Note (Signed)
Noted back in 2018.  Carotid Dopplers abdominal aortic-iliac Dopplers did not show any signs of any significant stenosis.  Continue to treat cardiovascular is factors of hypertension, hyperlipidemia and glycemic control.  She has quit smoking.

## 2019-09-12 ENCOUNTER — Ambulatory Visit
Admission: RE | Admit: 2019-09-12 | Discharge: 2019-09-12 | Disposition: A | Discharge status: Home / Self Care | Payer: Medicare Other | Source: Ambulatory Visit | Attending: Neurology | Admitting: Neurology

## 2019-09-12 ENCOUNTER — Other Ambulatory Visit: Payer: Self-pay

## 2019-09-12 DIAGNOSIS — R519 Headache, unspecified: Secondary | ICD-10-CM

## 2019-09-12 DIAGNOSIS — M542 Cervicalgia: Secondary | ICD-10-CM | POA: Diagnosis not present

## 2019-09-14 ENCOUNTER — Encounter: Payer: Self-pay | Admitting: Neurology

## 2019-09-14 ENCOUNTER — Telehealth: Payer: Self-pay | Admitting: Neurology

## 2019-09-14 NOTE — Telephone Encounter (Signed)
Left patient a detailed message, with results, on voicemail (ok per DPR).  I also let her know that if the neck pain is significant, she may call us back to schedule NCV/EMG. I explained the nature of the test on the message.

## 2019-09-14 NOTE — Progress Notes (Signed)
Alsie:  MRI of the brain was normal.  Marcial Pacas, M.D. Ph.D.  [facility] [place], [place] [address] Phone: [phone] Fax:      [phone]

## 2019-09-14 NOTE — Telephone Encounter (Signed)
IMPRESSION:   MRI cervical spine (without) demonstrating: - At C4-5, C5-6, C6-7: disc bulging and uncovertebral joint hypertrophy with mild spinal stenosis and severe biforaminal stenosis.  Please call patient, MRI of the brain was normal,  MRI of cervical spine showed multilevel degenerative changes, with evidence of neural foraminal narrowing at C4-5, C5-6, C6-7 level,  She still have significant neck pain, radiating pain to shoulder, arm, may consider EMG nerve conduction study for further evaluation

## 2019-11-14 DIAGNOSIS — Z0120 Encounter for dental examination and cleaning without abnormal findings: Secondary | ICD-10-CM | POA: Diagnosis not present

## 2019-12-18 ENCOUNTER — Encounter: Payer: Self-pay | Admitting: Cardiology

## 2019-12-18 DIAGNOSIS — Z23 Encounter for immunization: Secondary | ICD-10-CM | POA: Diagnosis not present

## 2019-12-18 DIAGNOSIS — M2042 Other hammer toe(s) (acquired), left foot: Secondary | ICD-10-CM | POA: Diagnosis not present

## 2019-12-18 DIAGNOSIS — M2041 Other hammer toe(s) (acquired), right foot: Secondary | ICD-10-CM | POA: Diagnosis not present

## 2019-12-18 DIAGNOSIS — Z0000 Encounter for general adult medical examination without abnormal findings: Secondary | ICD-10-CM | POA: Diagnosis not present

## 2019-12-19 ENCOUNTER — Other Ambulatory Visit: Payer: Self-pay | Admitting: Family Medicine

## 2019-12-19 DIAGNOSIS — R911 Solitary pulmonary nodule: Secondary | ICD-10-CM

## 2019-12-20 ENCOUNTER — Other Ambulatory Visit: Payer: Self-pay | Admitting: Family Medicine

## 2019-12-20 DIAGNOSIS — M8580 Other specified disorders of bone density and structure, unspecified site: Secondary | ICD-10-CM

## 2020-01-16 ENCOUNTER — Other Ambulatory Visit: Payer: Self-pay

## 2020-01-16 ENCOUNTER — Ambulatory Visit: Payer: Medicare Other | Admitting: Podiatry

## 2020-01-16 ENCOUNTER — Ambulatory Visit (INDEPENDENT_AMBULATORY_CARE_PROVIDER_SITE_OTHER): Payer: Medicare Other

## 2020-01-16 ENCOUNTER — Encounter: Payer: Self-pay | Admitting: Podiatry

## 2020-01-16 DIAGNOSIS — M7741 Metatarsalgia, right foot: Secondary | ICD-10-CM | POA: Diagnosis not present

## 2020-01-16 DIAGNOSIS — M2042 Other hammer toe(s) (acquired), left foot: Secondary | ICD-10-CM

## 2020-01-16 DIAGNOSIS — M21612 Bunion of left foot: Secondary | ICD-10-CM

## 2020-01-16 DIAGNOSIS — M2041 Other hammer toe(s) (acquired), right foot: Secondary | ICD-10-CM

## 2020-01-16 DIAGNOSIS — M21611 Bunion of right foot: Secondary | ICD-10-CM | POA: Diagnosis not present

## 2020-01-16 DIAGNOSIS — M7742 Metatarsalgia, left foot: Secondary | ICD-10-CM

## 2020-01-16 NOTE — Patient Instructions (Signed)
Bunion  A bunion is a bump on the base of the big toe that forms when the bones of the big toe joint move out of position. Bunions may be small at first, but they often get larger over time. They can make walking painful. What are the causes? A bunion may be caused by:  Wearing narrow or pointed shoes that force the big toe to press against the other toes.  Abnormal foot development that causes the foot to roll inward (pronate).  Changes in the foot that are caused by certain diseases, such as rheumatoid arthritis or polio.  A foot injury. What increases the risk? The following factors may make you more likely to develop this condition:  Wearing shoes that squeeze the toes together.  Having certain diseases, such as: ? Rheumatoid arthritis. ? Polio. ? Cerebral palsy.  Having family members who have bunions.  Being born with a foot deformity, such as flat feet or low arches.  Doing activities that put a lot of pressure on the feet, such as ballet dancing. What are the signs or symptoms? The main symptom of a bunion is a noticeable bump on the big toe. Other symptoms may include:  Pain.  Swelling around the big toe.  Redness and inflammation.  Thick or hardened skin on the big toe or between the toes.  Stiffness or loss of motion in the big toe.  Trouble with walking. How is this diagnosed? A bunion may be diagnosed based on your symptoms, medical history, and activities. You may have tests, such as:  X-rays. These allow your health care provider to check the position of the bones in your foot and look for damage to your joint. They also help your health care provider determine the severity of your bunion and the best way to treat it.  Joint aspiration. In this test, a sample of fluid is removed from the toe joint. This test may be done if you are in a lot of pain. It helps rule out diseases that cause painful swelling of the joints, such as arthritis. How is this  treated? Treatment depends on the severity of your symptoms. The goal of treatment is to relieve symptoms and prevent the bunion from getting worse. Your health care provider may recommend:  Wearing shoes that have a wide toe box.  Using bunion pads to cushion the affected area.  Taping your toes together to keep them in a normal position.  Placing a device inside your shoe (orthotics) to help reduce pressure on your toe joint.  Taking medicine to ease pain, inflammation, and swelling.  Applying heat or ice to the affected area.  Doing stretching exercises.  Surgery to remove scar tissue and move the toes back into their normal position. This treatment is rare. Follow these instructions at home: Managing pain, stiffness, and swelling   If directed, put ice on the painful area: ? Put ice in a plastic bag. ? Place a towel between your skin and the bag. ? Leave the ice on for 20 minutes, 2-3 times a day. Activity   If directed, apply heat to the affected area before you exercise. Use the heat source that your health care provider recommends, such as a moist heat pack or a heating pad. ? Place a towel between your skin and the heat source. ? Leave the heat on for 20-30 minutes. ? Remove the heat if your skin turns bright red. This is especially important if you are unable to feel pain,   heat, or cold. You may have a greater risk of getting burned.  Do exercises as told by your health care provider. General instructions  Support your toe joint with proper footwear, shoe padding, or taping as told by your health care provider.  Take over-the-counter and prescription medicines only as told by your health care provider.  Keep all follow-up visits as told by your health care provider. This is important. Contact a health care provider if your symptoms:  Get worse.  Do not improve in 2 weeks. Get help right away if you have:  Severe pain and trouble with walking. Summary  A  bunion is a bump on the base of the big toe that forms when the bones of the big toe joint move out of position.  Bunions can make walking painful.  Treatment depends on the severity of your symptoms.  Support your toe joint with proper footwear, shoe padding, or taping as told by your health care provider. This information is not intended to replace advice given to you by your health care provider. Make sure you discuss any questions you have with your health care provider. Document Revised: 09/13/2017 Document Reviewed: 07/20/2017   Hammer Toe  Hammer toe is a change in the shape (a deformity) of your toe. The deformity causes the middle joint of your toe to stay bent. This causes pain, especially when you are wearing shoes. Hammer toe starts gradually. At first, the toe can be straightened. Gradually over time, the deformity becomes stiff and permanent. Early treatments to keep the toe straight may relieve pain. As the deformity becomes stiff and permanent, surgery may be needed to straighten the toe. What are the causes? Hammer toe is caused by abnormal bending of the toe joint that is closest to your foot. It happens gradually over time. This pulls on the muscles and connections (tendons) of the toe joint, making them weak and stiff. It is often related to wearing shoes that are too short or narrow and do not let your toes straighten. What increases the risk? You may be at greater risk for hammer toe if you:  Are female.  Are older.  Wear shoes that are too small.  Wear high-heeled shoes that pinch your toes.  Are a Engineer, mining.  Have a second toe that is longer than your big toe (first toe).  Injure your foot or toe.  Have arthritis.  Have a family history of hammer toe.  Have a nerve or muscle disorder. What are the signs or symptoms? The main symptoms of this condition are pain and deformity of the toe. The pain is worse when wearing shoes, walking, or running. Other  symptoms may include:  Corns or calluses over the bent part of the toe or between the toes.  Redness and a burning feeling on the toe.  An open sore that forms on the top of the toe.  Not being able to straighten the toe. How is this diagnosed? This condition is diagnosed based on your symptoms and a physical exam. During the exam, your health care provider will try to straighten your toe to see how stiff the deformity is. You may also have tests, such as:  A blood test to check for rheumatoid arthritis.  An X-ray to show how severe the deformity is. How is this treated? Treatment for this condition will depend on how stiff the deformity is. Surgery is often needed. However, sometimes a hammer toe can be straightened without surgery. Treatments that  do not involve surgery include:  Taping the toe into a straightened position.  Using pads and cushions to protect the toe (orthotics).  Wearing shoes that provide enough room for the toes.  Doing toe-stretching exercises at home.  Taking an NSAID to reduce pain and swelling. If these treatments do not help or the toe cannot be straightened, surgery is the next option. The most common surgeries used to straighten a hammer toe include:  Arthroplasty. In this procedure, part of the joint is removed, and that allows the toe to straighten.  Fusion. In this procedure, cartilage between the two bones of the joint is taken out and the bones are fused together into one longer bone.  Implantation. In this procedure, part of the bone is removed and replaced with an implant to let the toe move again.  Flexor tendon transfer. In this procedure, the tendons that curl the toes down (flexor tendons) are repositioned. Follow these instructions at home:  Take over-the-counter and prescription medicines only as told by your health care provider.  Do toe straightening and stretching exercises as told by your health care provider.  Keep all follow-up  visits as told by your health care provider. This is important. How is this prevented?  Wear shoes that give your toes enough room and do not cause pain.  Do not wear high-heeled shoes. Contact a health care provider if:  Your pain gets worse.  Your toe becomes red or swollen.  You develop an open sore on your toe. This information is not intended to replace advice given to you by your health care provider. Make sure you discuss any questions you have with your health care provider. Document Revised: 02/19/2017 Document Reviewed: 07/03/2015 Elsevier Patient Education  2020 [place] Patient Education  [employer].

## 2020-01-17 NOTE — Progress Notes (Signed)
Subjective:   Patient ID: Laura Hayes, female   DOB: 64 y.o.   MRN: [id number]   HPI 64 year old female presents the office today for concerns of bunion on her left foot been ongoing for quite some time as well she started developed hammertoes of the last several months the left side worse than right of the second toe.  She wants to try to prevent this from occurring on the other toes and is getting worse in the second toe.  She just had a recent get some discomfort in the balls of her feet as well.  She denies any recent injury or trauma she said no recent treatment.  She does not have significant pain but she feels discomfort to the areas.   Review of Systems  All other systems reviewed and are negative.  Past Medical History:  Diagnosis Date  . Agatston coronary artery calcium score between 200 and 399 09/2016   Coronary calcium score 172.  Mid LAD <50% & mid RCA 30% calcified lesions.  Nonflow limiting.;;  April 2021-calcium score now increased to 2 and 39.  Scattered aortic valve and ascending aortic calcification noted.  . Aortic calcification ([place])   . Cluster headache   . Colon polyps   . COPD (chronic obstructive pulmonary disease) ([place])   . Depression   . Essential hypertension   . GERD (gastroesophageal reflux disease)   . Hypercholesteremia   . Vitamin D deficiency     Past Surgical History:  Procedure Laterality Date  . CESAREAN SECTION    . HAND SURGERY Right      Current Outpatient Medications:  .  acetaminophen (TYLENOL) 325 MG tablet, Take 650 mg by mouth every 6 (six) hours as needed., Disp: , Rfl:  .  aspirin 81 MG EC tablet, Take 81 mg by mouth daily., Disp: , Rfl:  .  buPROPion (WELLBUTRIN XL) 300 MG 24 hr tablet, Take 300 mg by mouth daily., Disp: , Rfl:  .  calcium carbonate (TUMS - DOSED IN MG ELEMENTAL CALCIUM) 500 MG chewable tablet, Chew 1 tablet by mouth daily as needed for indigestion or heartburn., Disp: , Rfl:  .  Cholecalciferol (VITAMIN D3)  50 MCG (2000 UT) TABS, Take by mouth., Disp: , Rfl:  .  ibuprofen (ADVIL,MOTRIN) 200 MG tablet, Take 200 mg by mouth every 6 (six) hours as needed., Disp: , Rfl:  .  losartan (COZAAR) 100 MG tablet, Take 1 tablet by mouth daily., Disp: , Rfl:  .  rosuvastatin (CRESTOR) 20 MG tablet, Take 20 mg by mouth daily., Disp: , Rfl:  .  sertraline (ZOLOFT) 100 MG tablet, Take 100 mg by mouth daily., Disp: , Rfl:  .  verapamil (CALAN-SR) 180 MG CR tablet, , Disp: , Rfl: 0  Allergies  Allergen Reactions  . Lisinopril Cough         Objective:  Physical Exam  General: AAO x3, NAD  Dermatological: Skin is warm, dry and supple bilateral.There are no open sores, no preulcerative lesions, no rash or signs of infection present.  Vascular: Dorsalis Pedis artery and Posterior Tibial artery pedal pulses are 2/4 bilateral with immedate capillary fill time.There is no pain with calf compression, swelling, warmth, erythema.   Neruologic: Grossly intact via light touch bilateral.   Musculoskeletal: But endpoint present on the left side.  There is hammertoe contracture present of the second toes left side worse than right as well which are flexible.  There is no other areas of discomfort.  She started  get some discomfort in the balls of her feet submetatarsal area but there is no specific area tenderness today.  There is no edema, erythema.  Muscular strength 5/5 in all groups tested bilateral.  Gait: Unassisted, Nonantalgic.       Assessment:   64 year old female with bunion, hammertoe deformities, metatarsalgia    Plan:  -Treatment options discussed including all alternatives, risks, and complications -Etiology of symptoms were discussed -X-rays were obtained and reviewed with the patient.  Bunion, hammertoe deformities are evident without any evidence of acute fracture or stress fracture. -We discussed with conservative as well as surgical treatment options.  She not having significant discomfort and  does not want proceed with surgical intervention.  Discussion modifications, stretching, exercises to help with the toes as well as well as wearing shoes with good arch supports.  Unfortunately, the bunion and hammertoes are constricting out without surgery but hopefully this will help her from second getting worse and help with her current discomfort.  Trula Slade DPM

## 2020-01-23 ENCOUNTER — Ambulatory Visit
Admission: RE | Admit: 2020-01-23 | Discharge: 2020-01-23 | Disposition: A | Discharge status: Home / Self Care | Payer: Medicare Other | Source: Ambulatory Visit | Attending: Family Medicine | Admitting: Family Medicine

## 2020-01-23 DIAGNOSIS — J432 Centrilobular emphysema: Secondary | ICD-10-CM | POA: Diagnosis not present

## 2020-01-23 DIAGNOSIS — Z87891 Personal history of nicotine dependence: Secondary | ICD-10-CM | POA: Diagnosis not present

## 2020-01-23 DIAGNOSIS — I700 Atherosclerosis of aorta: Secondary | ICD-10-CM | POA: Diagnosis not present

## 2020-01-23 DIAGNOSIS — R911 Solitary pulmonary nodule: Secondary | ICD-10-CM

## 2020-01-23 DIAGNOSIS — I2510 Atherosclerotic heart disease of native coronary artery without angina pectoris: Secondary | ICD-10-CM | POA: Diagnosis not present

## 2020-04-09 ENCOUNTER — Other Ambulatory Visit: Payer: Self-pay | Admitting: Family Medicine

## 2020-04-09 DIAGNOSIS — Z1231 Encounter for screening mammogram for malignant neoplasm of breast: Secondary | ICD-10-CM

## 2020-04-18 ENCOUNTER — Other Ambulatory Visit: Payer: Medicare Other

## 2020-04-24 ENCOUNTER — Ambulatory Visit
Admission: RE | Admit: 2020-04-24 | Discharge: 2020-04-24 | Disposition: A | Discharge status: Home / Self Care | Payer: Medicare Other | Source: Ambulatory Visit

## 2020-04-24 ENCOUNTER — Other Ambulatory Visit: Payer: Self-pay

## 2020-04-24 DIAGNOSIS — Z1231 Encounter for screening mammogram for malignant neoplasm of breast: Secondary | ICD-10-CM

## 2020-06-17 DIAGNOSIS — F329 Major depressive disorder, single episode, unspecified: Secondary | ICD-10-CM | POA: Diagnosis not present

## 2020-06-17 DIAGNOSIS — I10 Essential (primary) hypertension: Secondary | ICD-10-CM | POA: Diagnosis not present

## 2020-06-17 DIAGNOSIS — R519 Headache, unspecified: Secondary | ICD-10-CM | POA: Diagnosis not present

## 2020-07-19 ENCOUNTER — Other Ambulatory Visit: Payer: Self-pay

## 2020-07-19 ENCOUNTER — Ambulatory Visit
Admission: RE | Admit: 2020-07-19 | Discharge: 2020-07-19 | Disposition: A | Discharge status: Home / Self Care | Payer: Medicare Other | Source: Ambulatory Visit | Attending: Family Medicine | Admitting: Family Medicine

## 2020-07-19 DIAGNOSIS — M8580 Other specified disorders of bone density and structure, unspecified site: Secondary | ICD-10-CM

## 2020-07-19 DIAGNOSIS — Z780 Asymptomatic menopausal state: Secondary | ICD-10-CM | POA: Diagnosis not present

## 2020-07-19 DIAGNOSIS — M8589 Other specified disorders of bone density and structure, multiple sites: Secondary | ICD-10-CM | POA: Diagnosis not present

## 2021-01-03 ENCOUNTER — Other Ambulatory Visit: Payer: Self-pay | Admitting: Family Medicine

## 2021-01-03 DIAGNOSIS — F329 Major depressive disorder, single episode, unspecified: Secondary | ICD-10-CM | POA: Diagnosis not present

## 2021-01-03 DIAGNOSIS — E7800 Pure hypercholesterolemia, unspecified: Secondary | ICD-10-CM | POA: Diagnosis not present

## 2021-01-03 DIAGNOSIS — I10 Essential (primary) hypertension: Secondary | ICD-10-CM | POA: Diagnosis not present

## 2021-01-03 DIAGNOSIS — Z23 Encounter for immunization: Secondary | ICD-10-CM | POA: Diagnosis not present

## 2021-01-03 DIAGNOSIS — Z0000 Encounter for general adult medical examination without abnormal findings: Secondary | ICD-10-CM | POA: Diagnosis not present

## 2021-01-07 ENCOUNTER — Other Ambulatory Visit: Payer: Self-pay | Admitting: Family Medicine

## 2021-01-07 DIAGNOSIS — R911 Solitary pulmonary nodule: Secondary | ICD-10-CM

## 2021-01-08 ENCOUNTER — Other Ambulatory Visit: Payer: Self-pay | Admitting: Family Medicine

## 2021-01-08 DIAGNOSIS — R911 Solitary pulmonary nodule: Secondary | ICD-10-CM

## 2021-01-13 DIAGNOSIS — H524 Presbyopia: Secondary | ICD-10-CM | POA: Diagnosis not present

## 2021-01-23 ENCOUNTER — Ambulatory Visit
Admission: RE | Admit: 2021-01-23 | Discharge: 2021-01-23 | Disposition: A | Discharge status: Home / Self Care | Payer: Medicare Other | Source: Ambulatory Visit | Attending: Family Medicine | Admitting: Family Medicine

## 2021-01-23 ENCOUNTER — Other Ambulatory Visit: Payer: Medicare Other

## 2021-01-23 DIAGNOSIS — I2510 Atherosclerotic heart disease of native coronary artery without angina pectoris: Secondary | ICD-10-CM | POA: Diagnosis not present

## 2021-01-23 DIAGNOSIS — R911 Solitary pulmonary nodule: Secondary | ICD-10-CM

## 2021-01-23 DIAGNOSIS — Z87891 Personal history of nicotine dependence: Secondary | ICD-10-CM | POA: Diagnosis not present

## 2021-01-23 DIAGNOSIS — N200 Calculus of kidney: Secondary | ICD-10-CM | POA: Diagnosis not present

## 2021-01-23 DIAGNOSIS — J432 Centrilobular emphysema: Secondary | ICD-10-CM | POA: Diagnosis not present

## 2021-03-20 ENCOUNTER — Institutional Professional Consult (permissible substitution): Payer: Medicare Other | Admitting: Pulmonary Disease

## 2021-03-20 ENCOUNTER — Ambulatory Visit: Payer: Medicare Other | Admitting: Pulmonary Disease

## 2021-03-20 ENCOUNTER — Encounter: Payer: Self-pay | Admitting: Pulmonary Disease

## 2021-03-20 ENCOUNTER — Other Ambulatory Visit: Payer: Self-pay

## 2021-03-20 VITALS — BP 128/70 | HR 95 | Temp 97.7°F | Ht 63.0 in | Wt 123.4 lb

## 2021-03-20 DIAGNOSIS — Z87891 Personal history of nicotine dependence: Secondary | ICD-10-CM

## 2021-03-20 DIAGNOSIS — J432 Centrilobular emphysema: Secondary | ICD-10-CM | POA: Diagnosis not present

## 2021-03-20 DIAGNOSIS — R918 Other nonspecific abnormal finding of lung field: Secondary | ICD-10-CM

## 2021-03-20 MED ORDER — ALBUTEROL SULFATE HFA 108 (90 BASE) MCG/ACT IN AERS
2.0000 | INHALATION_SPRAY | Freq: Four times a day (QID) | RESPIRATORY_TRACT | 6 refills | Status: DC | PRN
Start: 2021-03-20 — End: 2022-06-15

## 2021-03-20 MED ORDER — SPIRIVA RESPIMAT 1.25 MCG/ACT IN AERS: 2.0000 | INHALATION_SPRAY | Freq: Every day | RESPIRATORY_TRACT | 3 refills | Status: DC

## 2021-03-20 NOTE — Patient Instructions (Addendum)
Thank you for visiting Dr. Valeta Harms at [facility]. Today we recommend the following:  Orders Placed This Encounter  Procedures   Pulmonary Function Test   Meds ordered this encounter  Medications   Tiotropium Bromide Monohydrate (SPIRIVA RESPIMAT) 1.25 MCG/ACT AERS    Sig: Inhale 2 puffs into the lungs daily.    Dispense:  3 each    Refill:  3   albuterol (VENTOLIN HFA) 108 (90 Base) MCG/ACT inhaler    Sig: Inhale 2 puffs into the lungs every 6 (six) hours as needed for wheezing or shortness of breath.    Dispense:  8 g    Refill:  6   PFTs prior to next office visit.  Return in about 8 weeks (around 05/15/2021) for with APP or Dr. Valeta Harms.    Please do your part to reduce the spread of COVID-19.

## 2021-03-20 NOTE — Progress Notes (Signed)
Synopsis: Referred in December 2022 for lung nodule by [facility] Smoker, *  Subjective:   PATIENT ID: Laura Hayes GENDER: female DOB: 03/25/1955, MRN: [id number]  Chief Complaint  Patient presents with   Consult    Patient is here to talk about lung nodule.    This is a 65 year old female, former smoker quit 5 years ago enrolled in lung cancer screening yearly.  Has had nodules followed over time.  She had a recent lung cancer screening CT lung RADS 4A.  We reviewed the CT imaging today in the office.  However I think the report actually describes this "new nodule as a nodule that has been followed for some time.  I will clarify with radiology.  She does have centrilobular emphysema on CT imaging as well.  She does have shortness of breath and breathlessness when she is playing with grandkids.  She not on any maintenance inhalers at this time.  Previous pulmonary function test with normal ratio and preserved spirometry with a normal DLCO back in 2015.  This was reviewed today in the office with patient.   Past Medical History:  Diagnosis Date   Agatston coronary artery calcium score between 200 and 399 09/2016   Coronary calcium score 172.  Mid LAD <50% & mid RCA 30% calcified lesions.  Nonflow limiting.;;  April 2021-calcium score now increased to 2 and 39.  Scattered aortic valve and ascending aortic calcification noted.   Aortic calcification ([facility])    Cluster headache    Colon polyps    COPD (chronic obstructive pulmonary disease) ([place])    Depression    Essential hypertension    GERD (gastroesophageal reflux disease)    Hypercholesteremia    Vitamin D deficiency      Family History  Problem Relation Age of Onset   Migraines Mother    Transient ischemic attack Mother    Dementia Mother    Hypertension Mother    Hyperlipidemia Mother    Lung disease Father    Hypertension Father    Emphysema Father    COPD Father    Coronary artery disease Father        s/p  PCI, CABG   Other Son        Tricuspid atresa with VSD   Hypertension Brother    Stroke Maternal Grandmother    Heart attack Maternal Grandfather    Heart attack Paternal Grandfather    Heart attack Brother    Stroke Brother    Breast cancer Neg Hx      Past Surgical History:  Procedure Laterality Date   CESAREAN SECTION     HAND SURGERY Right     Social History   Socioeconomic History   Marital status: Married    Spouse name: Not on file   Number of children: 1   Years of education: 13   Highest education level: Not on file  Occupational History   Occupation: Photographer  Tobacco Use   Smoking status: Former    Types: E-cigarettes    Quit date: 03/06/2015    Years since quitting: 6.0   Smokeless tobacco: Never  Substance and Sexual Activity   Alcohol use: Yes    Alcohol/week: 1.0 standard drink    Types: 1 Standard drinks or equivalent per week    Comment: Seldom - social   Drug use: No    Comment: past hx of marijuana use in 70's   Sexual activity: Yes  Other Topics Concern  Not on file  Social History Narrative   Lives at home with husband.   Had 3 total children, 2 living.  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   Right-handed.   2 liter caffeine daily (diet soda).   Social Determinants of Health   Financial Resource Strain: Not on file  Food Insecurity: Not on file  Transportation Needs: Not on file  Physical Activity: Not on file  Stress: Not on file  Social Connections: Not on file  Intimate Partner Violence: Not on file     Allergies  Allergen Reactions   Lisinopril Cough     Outpatient Medications Prior to Visit  Medication Sig Dispense Refill   acetaminophen (TYLENOL) 325 MG tablet Take 650 mg by mouth every 6 (six) hours as needed.     aspirin 81 MG EC tablet Take 81 mg by mouth daily.     buPROPion (WELLBUTRIN XL) 300 MG 24 hr tablet Take 300 mg by mouth daily.     calcium carbonate (TUMS -  DOSED IN MG ELEMENTAL CALCIUM) 500 MG chewable tablet Chew 1 tablet by mouth daily as needed for indigestion or heartburn.     Cholecalciferol (VITAMIN D3) 50 MCG (2000 UT) TABS Take by mouth.     ibuprofen (ADVIL,MOTRIN) 200 MG tablet Take 200 mg by mouth every 6 (six) hours as needed.     losartan (COZAAR) 100 MG tablet Take 1 tablet by mouth daily.     rosuvastatin (CRESTOR) 20 MG tablet Take 20 mg by mouth daily.     sertraline (ZOLOFT) 100 MG tablet Take 100 mg by mouth daily.     verapamil (CALAN-SR) 180 MG CR tablet   0   No facility-administered medications prior to visit.    Review of Systems  Constitutional:  Negative for chills, fever, malaise/fatigue and weight loss.  HENT:  Negative for hearing loss, sore throat and tinnitus.   Eyes:  Negative for blurred vision and double vision.  Respiratory:  Positive for shortness of breath. Negative for cough, hemoptysis, sputum production, wheezing and stridor.        Breathlessness while playing with grandkids.  Cardiovascular:  Negative for chest pain, palpitations, orthopnea, leg swelling and PND.  Gastrointestinal:  Negative for abdominal pain, constipation, diarrhea, heartburn, nausea and vomiting.  Genitourinary:  Negative for dysuria, hematuria and urgency.  Musculoskeletal:  Negative for joint pain and myalgias.  Skin:  Negative for itching and rash.  Neurological:  Negative for dizziness, tingling, weakness and headaches.  Endo/Heme/Allergies:  Negative for environmental allergies. Does not bruise/bleed easily.  Psychiatric/Behavioral:  Negative for depression. The patient is not nervous/anxious and does not have insomnia.   All other systems reviewed and are negative.   Objective:  Physical Exam Vitals reviewed.  Constitutional:      General: She is not in acute distress.    Appearance: She is well-developed.  HENT:     Head: Normocephalic and atraumatic.  Eyes:     General: No scleral icterus.    Conjunctiva/sclera:  Conjunctivae normal.     Pupils: Pupils are equal, round, and reactive to light.  Neck:     Vascular: No JVD.     Trachea: No tracheal deviation.  Cardiovascular:     Rate and Rhythm: Normal rate and regular rhythm.     Heart sounds: Normal heart sounds. No murmur heard. Pulmonary:     Effort: Pulmonary effort is normal. No tachypnea, accessory muscle usage or respiratory distress.  Breath sounds: No stridor. No wheezing, rhonchi or rales.  Abdominal:     General: There is no distension.     Palpations: Abdomen is soft.     Tenderness: There is no abdominal tenderness.  Musculoskeletal:        General: No tenderness.     Cervical back: Neck supple.  Lymphadenopathy:     Cervical: No cervical adenopathy.  Skin:    General: Skin is warm and dry.     Capillary Refill: Capillary refill takes less than 2 seconds.     Findings: No rash.  Neurological:     Mental Status: She is alert and oriented to person, place, and time.  Psychiatric:        Behavior: Behavior normal.     Vitals:   03/20/21 0930  BP: 128/70  Pulse: 95  Temp: 97.7 F (36.5 C)  TempSrc: Oral  SpO2: 99%  Weight: 123 lb 6.4 oz (56 kg)  Height: 5\' 3"  (1.6 m)   99% on RA BMI Readings from Last 3 Encounters:  03/20/21 21.86 kg/m  08/07/19 23.21 kg/m  08/01/19 23.21 kg/m   Wt Readings from Last 3 Encounters:  03/20/21 123 lb 6.4 oz (56 kg)  08/01/19 131 lb (59.4 kg)  07/04/19 128 lb 6.4 oz (58.2 kg)     CBC No results found for: WBC, RBC, HGB, HCT, PLT, MCV, MCH, MCHC, RDW, LYMPHSABS, MONOABS, EOSABS, BASOSABS   Chest Imaging: 01/25/2021 lung cancer screening CT: Multiple stable pulmonary nodules.  Evidence of centrilobular emphysema. The patient's images have been independently reviewed by me.    Pulmonary Functions Testing Results: PFT Results Latest Ref Rng & Units 03/24/2013  FVC-Pre L 3.22  FVC-Predicted Pre % 100  FVC-Post L 3.29  FVC-Predicted Post % 102  Pre FEV1/FVC % % 72  Post  FEV1/FCV % % 73  FEV1-Pre L 2.33  FEV1-Predicted Pre % 93  FEV1-Post L 2.39  DLCO uncorrected ml/min/mmHg 17.60  DLCO UNC% % 76  DLVA Predicted % 76  TLC L 5.40  TLC % Predicted % 110  RV % Predicted % 113    FeNO:   Pathology:   Echocardiogram:   Heart Catheterization:     Assessment & Plan:     ICD-10-CM   1. Centrilobular emphysema ([place])  J43.2     2. Multiple lung nodules  R91.8     3. Former smoker  Z87.891     4. Abnormal CT lung screening  R91.8       Discussion:  This is a 65 year old female, former smoker followed in lung cancer screening program, yearly screening with multiple pulmonary nodules.  Father who died from lung cancer, small cell.  She quit smoking 5+ years ago.  She does have centrilobular emphysema and she had pulmonary function test completed in 2015  Plan: [place] prescription for this as well as albuterol as needed. Demonstrate to patient how to use inhalers today in the office. Pulmonary function tests prior to next office visit. She needs annual lung cancer screening CT continued. We will contact radiology about review of her recent report.  As I think the documented "new nodule" is actually a nodule that has been followed for some time and I am not sure that she needs a repeat 74-month CT.  If they agree and the report is addended then we can have her follow-up in 1 year for lung cancer screening. Return to clinic in 8 weeks after PFTs are complete and see  how she is doing with her new inhaler.   Current Outpatient Medications:    acetaminophen (TYLENOL) 325 MG tablet, Take 650 mg by mouth every 6 (six) hours as needed., Disp: , Rfl:    albuterol (VENTOLIN HFA) 108 (90 Base) MCG/ACT inhaler, Inhale 2 puffs into the lungs every 6 (six) hours as needed for wheezing or shortness of breath., Disp: 8 g, Rfl: 6   aspirin 81 MG EC tablet, Take 81 mg by mouth daily., Disp: , Rfl:    buPROPion (WELLBUTRIN XL) 300 MG 24 hr tablet, Take  300 mg by mouth daily., Disp: , Rfl:    calcium carbonate (TUMS - DOSED IN MG ELEMENTAL CALCIUM) 500 MG chewable tablet, Chew 1 tablet by mouth daily as needed for indigestion or heartburn., Disp: , Rfl:    Cholecalciferol (VITAMIN D3) 50 MCG (2000 UT) TABS, Take by mouth., Disp: , Rfl:    ibuprofen (ADVIL,MOTRIN) 200 MG tablet, Take 200 mg by mouth every 6 (six) hours as needed., Disp: , Rfl:    losartan (COZAAR) 100 MG tablet, Take 1 tablet by mouth daily., Disp: , Rfl:    rosuvastatin (CRESTOR) 20 MG tablet, Take 20 mg by mouth daily., Disp: , Rfl:    sertraline (ZOLOFT) 100 MG tablet, Take 100 mg by mouth daily., Disp: , Rfl:    Tiotropium Bromide Monohydrate (SPIRIVA RESPIMAT) 1.25 MCG/ACT AERS, Inhale 2 puffs into the lungs daily., Disp: 3 each, Rfl: 3   verapamil (CALAN-SR) 180 MG CR tablet, , Disp: , Rfl: 0  Stop record  Garner Nash, DO [facility] 03/20/2021 9:53 AM

## 2021-04-04 ENCOUNTER — Institutional Professional Consult (permissible substitution): Payer: Medicare Other | Admitting: Pulmonary Disease

## 2021-04-14 ENCOUNTER — Institutional Professional Consult (permissible substitution): Payer: Medicare Other | Admitting: Pulmonary Disease

## 2021-05-09 ENCOUNTER — Ambulatory Visit (INDEPENDENT_AMBULATORY_CARE_PROVIDER_SITE_OTHER): Payer: Medicare Other | Admitting: Pulmonary Disease

## 2021-05-09 ENCOUNTER — Encounter: Payer: Self-pay | Admitting: Pulmonary Disease

## 2021-05-09 ENCOUNTER — Ambulatory Visit: Payer: Medicare Other | Admitting: Pulmonary Disease

## 2021-05-09 ENCOUNTER — Other Ambulatory Visit: Payer: Self-pay

## 2021-05-09 VITALS — BP 106/66 | HR 88 | Temp 98.2°F | Ht 62.0 in | Wt 129.2 lb

## 2021-05-09 DIAGNOSIS — Z87891 Personal history of nicotine dependence: Secondary | ICD-10-CM | POA: Diagnosis not present

## 2021-05-09 DIAGNOSIS — J432 Centrilobular emphysema: Secondary | ICD-10-CM

## 2021-05-09 DIAGNOSIS — R918 Other nonspecific abnormal finding of lung field: Secondary | ICD-10-CM | POA: Diagnosis not present

## 2021-05-09 LAB — PULMONARY FUNCTION TEST
DL/VA % pred: 74 %
DL/VA: 3.14 ml/min/mmHg/L
DLCO cor % pred: 82 %
DLCO cor: 15.73 ml/min/mmHg
DLCO unc % pred: 82 %
DLCO unc: 15.73 ml/min/mmHg
FEF 25-75 Post: 1.85 L/sec
FEF 25-75 Pre: 1.66 L/sec
FEF2575-%Change-Post: 11 %
FEF2575-%Pred-Post: 93 %
FEF2575-%Pred-Pre: 83 %
FEV1-%Change-Post: 2 %
FEV1-%Pred-Post: 111 %
FEV1-%Pred-Pre: 109 %
FEV1-Post: 2.53 L
FEV1-Pre: 2.48 L
FEV1FVC-%Change-Post: -1 %
FEV1FVC-%Pred-Pre: 94 %
FEV6-%Change-Post: 4 %
FEV6-%Pred-Post: 123 %
FEV6-%Pred-Pre: 118 %
FEV6-Post: 3.51 L
FEV6-Pre: 3.38 L
FEV6FVC-%Change-Post: 0 %
FEV6FVC-%Pred-Post: 103 %
FEV6FVC-%Pred-Pre: 103 %
FVC-%Change-Post: 4 %
FVC-%Pred-Post: 119 %
FVC-%Pred-Pre: 114 %
FVC-Post: 3.55 L
FVC-Pre: 3.41 L
Post FEV1/FVC ratio: 71 %
Post FEV6/FVC ratio: 99 %
Pre FEV1/FVC ratio: 73 %
Pre FEV6/FVC Ratio: 99 %
RV % pred: 129 %
RV: 2.69 L
TLC % pred: 122 %
TLC: 6 L

## 2021-05-09 NOTE — Progress Notes (Signed)
Synopsis: Referred in December 2022 for lung nodule by Glenis Smoker, *  Subjective:   PATIENT ID: Laura Hayes GENDER: female DOB: May 05, 1955, MRN: [id number]  Chief Complaint  Patient presents with   Follow-up    Follow up on PFT results.     This is a 66 year old female, former smoker quit 5 years ago enrolled in lung cancer screening yearly.  Has had nodules followed over time.  She had a recent lung cancer screening CT lung RADS 4A.  We reviewed the CT imaging today in the office.  However I think the report actually describes this "new nodule as a nodule that has been followed for some time.  I will clarify with radiology.  She does have centrilobular emphysema on CT imaging as well.  She does have shortness of breath and breathlessness when she is playing with grandkids.  She not on any maintenance inhalers at this time.  Previous pulmonary function test with normal ratio and preserved spirometry with a normal DLCO back in 2015.  This was reviewed today in the office with patient.  OV 05/09/2021: Here today for follow-up after PFTs completed.  Initially saw [place] for abnormal lung cancer screening CT. patient was given Spiriva Respimat last time but does not use it.  She had PFTs completed prior to the office visit today which revealed normal ratio, no evidence of obstruction, mild elevation in air trapping on her lung volumes.  Overall from respiratory standpoint she is able to complete all of all of her activities of daily living.   Past Medical History:  Diagnosis Date   Agatston coronary artery calcium score between 200 and 399 09/2016   Coronary calcium score 172.  Mid LAD <50% & mid RCA 30% calcified lesions.  Nonflow limiting.;;  April 2021-calcium score now increased to 2 and 39.  Scattered aortic valve and ascending aortic calcification noted.   Aortic calcification (HCC)    Cluster headache    Colon polyps    COPD (chronic obstructive pulmonary disease) ([place])     Depression    Essential hypertension    GERD (gastroesophageal reflux disease)    Hypercholesteremia    Vitamin D deficiency      Family History  Problem Relation Age of Onset   Migraines Mother    Transient ischemic attack Mother    Dementia Mother    Hypertension Mother    Hyperlipidemia Mother    Lung disease Father    Hypertension Father    Emphysema Father    COPD Father    Coronary artery disease Father        s/p PCI, CABG   Other Son        Tricuspid atresa with VSD   Hypertension Brother    Stroke Maternal Grandmother    Heart attack Maternal Grandfather    Heart attack Paternal Grandfather    Heart attack Brother    Stroke Brother    Breast cancer Neg Hx      Past Surgical History:  Procedure Laterality Date   CESAREAN SECTION     HAND SURGERY Right     Social History   Socioeconomic History   Marital status: Married    Spouse name: Not on file   Number of children: 1   Years of education: 13   Highest education level: Not on file  Occupational History   Occupation: Photographer  Tobacco Use   Smoking status: Former    Types: E-cigarettes  Quit date: 03/06/2015    Years since quitting: 6.1   Smokeless tobacco: Never  Substance and Sexual Activity   Alcohol use: Yes    Alcohol/week: 1.0 standard drink    Types: 1 Standard drinks or equivalent per week    Comment: Seldom - social   Drug use: No    Comment: past hx of marijuana use in 70's   Sexual activity: Yes  Other Topics Concern   Not on file  Social History Narrative   Lives at home with husband.   Had 3 total children, 2 living.  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   Right-handed.   2 liter caffeine daily (diet soda).   Social Determinants of Health   Financial Resource Strain: Not on file  Food Insecurity: Not on file  Transportation Needs: Not on file  Physical Activity: Not on file  Stress: Not on file  Social Connections: Not  on file  Intimate Partner Violence: Not on file     Allergies  Allergen Reactions   Lisinopril Cough     Outpatient Medications Prior to Visit  Medication Sig Dispense Refill   acetaminophen (TYLENOL) 325 MG tablet Take 650 mg by mouth every 6 (six) hours as needed.     albuterol (VENTOLIN HFA) 108 (90 Base) MCG/ACT inhaler Inhale 2 puffs into the lungs every 6 (six) hours as needed for wheezing or shortness of breath. 8 g 6   aspirin 81 MG EC tablet Take 81 mg by mouth daily.     buPROPion (WELLBUTRIN XL) 300 MG 24 hr tablet Take 300 mg by mouth daily.     calcium carbonate (TUMS - DOSED IN MG ELEMENTAL CALCIUM) 500 MG chewable tablet Chew 1 tablet by mouth daily as needed for indigestion or heartburn.     Cholecalciferol (VITAMIN D3) 50 MCG (2000 UT) TABS Take by mouth.     ibuprofen (ADVIL,MOTRIN) 200 MG tablet Take 200 mg by mouth every 6 (six) hours as needed.     losartan (COZAAR) 100 MG tablet Take 1 tablet by mouth daily.     rosuvastatin (CRESTOR) 20 MG tablet Take 20 mg by mouth daily.     sertraline (ZOLOFT) 100 MG tablet Take 100 mg by mouth daily.     Tiotropium Bromide Monohydrate (SPIRIVA RESPIMAT) 1.25 MCG/ACT AERS Inhale 2 puffs into the lungs daily. 3 each 3   verapamil (CALAN-SR) 180 MG CR tablet   0   No facility-administered medications prior to visit.    Review of Systems  Constitutional:  Negative for chills, fever, malaise/fatigue and weight loss.  HENT:  Negative for hearing loss, sore throat and tinnitus.   Eyes:  Negative for blurred vision and double vision.  Respiratory:  Negative for cough, hemoptysis, sputum production, shortness of breath, wheezing and stridor.   Cardiovascular:  Negative for chest pain, palpitations, orthopnea, leg swelling and PND.  Gastrointestinal:  Negative for abdominal pain, constipation, diarrhea, heartburn, nausea and vomiting.  Genitourinary:  Negative for dysuria, hematuria and urgency.  Musculoskeletal:  Negative for  joint pain and myalgias.  Skin:  Negative for itching and rash.  Neurological:  Negative for dizziness, tingling, weakness and headaches.  Endo/Heme/Allergies:  Negative for environmental allergies. Does not bruise/bleed easily.  Psychiatric/Behavioral:  Negative for depression. The patient is not nervous/anxious and does not have insomnia.   All other systems reviewed and are negative.   Objective:  Physical Exam Vitals reviewed.  Constitutional:  General: She is not in acute distress.    Appearance: She is well-developed.  HENT:     Head: Normocephalic and atraumatic.  Eyes:     General: No scleral icterus.    Conjunctiva/sclera: Conjunctivae normal.     Pupils: Pupils are equal, round, and reactive to light.  Neck:     Vascular: No JVD.     Trachea: No tracheal deviation.  Cardiovascular:     Rate and Rhythm: Normal rate and regular rhythm.     Heart sounds: Normal heart sounds. No murmur heard. Pulmonary:     Effort: Pulmonary effort is normal. No tachypnea, accessory muscle usage or respiratory distress.     Breath sounds: No stridor. No wheezing, rhonchi or rales.  Abdominal:     General: There is no distension.     Palpations: Abdomen is soft.     Tenderness: There is no abdominal tenderness.  Musculoskeletal:        General: No tenderness.     Cervical back: Neck supple.  Lymphadenopathy:     Cervical: No cervical adenopathy.  Skin:    General: Skin is warm and dry.     Capillary Refill: Capillary refill takes less than 2 seconds.     Findings: No rash.  Neurological:     Mental Status: She is alert and oriented to person, place, and time.  Psychiatric:        Behavior: Behavior normal.     Vitals:   05/09/21 1344  BP: 106/66  Pulse: 88  Temp: 98.2 F (36.8 C)  TempSrc: Oral  SpO2: 97%  Weight: 129 lb 3.2 oz (58.6 kg)  Height: 5\' 2"  (1.575 m)    97% on RA BMI Readings from Last 3 Encounters:  05/09/21 23.63 kg/m  03/20/21 21.86 kg/m   08/07/19 23.21 kg/m   Wt Readings from Last 3 Encounters:  05/09/21 129 lb 3.2 oz (58.6 kg)  03/20/21 123 lb 6.4 oz (56 kg)  08/01/19 131 lb (59.4 kg)     CBC No results found for: WBC, RBC, HGB, HCT, PLT, MCV, MCH, MCHC, RDW, LYMPHSABS, MONOABS, EOSABS, BASOSABS   Chest Imaging: 01/25/2021 lung cancer screening CT: Multiple stable pulmonary nodules.  Evidence of centrilobular emphysema. The patient's images have been independently reviewed by me.    Pulmonary Functions Testing Results: PFT Results Latest Ref Rng & Units 05/09/2021 03/24/2013  FVC-Pre L 3.41 3.22  FVC-Predicted Pre % 114 100  FVC-Post L 3.55 3.29  FVC-Predicted Post % 119 102  Pre FEV1/FVC % % 73 72  Post FEV1/FCV % % 71 73  FEV1-Pre L 2.48 2.33  FEV1-Predicted Pre % 109 93  FEV1-Post L 2.53 2.39  DLCO uncorrected ml/min/mmHg 15.73 17.60  DLCO UNC% % 82 76  DLCO corrected ml/min/mmHg 15.73 -  DLCO COR %Predicted % 82 -  DLVA Predicted % 74 76  TLC L 6.00 5.40  TLC % Predicted % 122 110  RV % Predicted % 129 113    FeNO:   Pathology:   Echocardiogram:   Heart Catheterization:     Assessment & Plan:     ICD-10-CM   1. Multiple lung nodules  R91.8 CT Chest Wo Contrast    2. Centrilobular emphysema ([place])  J43.2     3. Former smoker  Z87.891        Discussion:  This is a 66 year old female, former smoker enrolled in our lung cancer screening program had a recent CT last year who had multiple pulmonary nodules.  She had a father who died from lung cancer, small cell carcinoma.  She quit smoking 5+ years ago.  Pulmonary function test were completed in 2015 with no evidence of obstruction.  She does have a CT radiographic evidence of paraseptal and centrilobular emphysema.  Pulmonary function test completed prior to today's office visit does not reveal any significant obstruction.  She does have some air trapping  Plan: She needs a repeat CT scan of the chest in May 2023. If the CT scan of the  chest is stable she can reenroll in our lung cancer screening program the following year. She is on a let us know if she has any change in her respiratory symptoms. At this time not on any inhalers. Otherwise return to clinic in 1 year or as needed.   Current Outpatient Medications:    acetaminophen (TYLENOL) 325 MG tablet, Take 650 mg by mouth every 6 (six) hours as needed., Disp: , Rfl:    albuterol (VENTOLIN HFA) 108 (90 Base) MCG/ACT inhaler, Inhale 2 puffs into the lungs every 6 (six) hours as needed for wheezing or shortness of breath., Disp: 8 g, Rfl: 6   aspirin 81 MG EC tablet, Take 81 mg by mouth daily., Disp: , Rfl:    buPROPion (WELLBUTRIN XL) 300 MG 24 hr tablet, Take 300 mg by mouth daily., Disp: , Rfl:    calcium carbonate (TUMS - DOSED IN MG ELEMENTAL CALCIUM) 500 MG chewable tablet, Chew 1 tablet by mouth daily as needed for indigestion or heartburn., Disp: , Rfl:    Cholecalciferol (VITAMIN D3) 50 MCG (2000 UT) TABS, Take by mouth., Disp: , Rfl:    ibuprofen (ADVIL,MOTRIN) 200 MG tablet, Take 200 mg by mouth every 6 (six) hours as needed., Disp: , Rfl:    losartan (COZAAR) 100 MG tablet, Take 1 tablet by mouth daily., Disp: , Rfl:    rosuvastatin (CRESTOR) 20 MG tablet, Take 20 mg by mouth daily., Disp: , Rfl:    sertraline (ZOLOFT) 100 MG tablet, Take 100 mg by mouth daily., Disp: , Rfl:    Tiotropium Bromide Monohydrate (SPIRIVA RESPIMAT) 1.25 MCG/ACT AERS, Inhale 2 puffs into the lungs daily., Disp: 3 each, Rfl: 3   verapamil (CALAN-SR) 180 MG CR tablet, , Disp: , Rfl: 0   Garner Nash, DO  Pulmonary Critical Care 05/09/2021 1:54 PM

## 2021-05-09 NOTE — Progress Notes (Signed)
PFT done today. 

## 2021-05-09 NOTE — Patient Instructions (Addendum)
Thank you for visiting Dr. Valeta Harms at [facility]. Today we recommend the following:  Orders Placed This Encounter  Procedures   CT Chest Wo Contrast   Repeat CT Chest in May 2024,  And if normal can return to LDCT screening program in a year.     Please do your part to reduce the spread of COVID-19.

## 2021-05-16 ENCOUNTER — Ambulatory Visit: Payer: Medicare Other | Admitting: Pulmonary Disease

## 2021-06-23 ENCOUNTER — Other Ambulatory Visit: Payer: Self-pay | Admitting: Family Medicine

## 2021-06-23 DIAGNOSIS — Z1231 Encounter for screening mammogram for malignant neoplasm of breast: Secondary | ICD-10-CM

## 2021-07-08 DIAGNOSIS — E7800 Pure hypercholesterolemia, unspecified: Secondary | ICD-10-CM | POA: Diagnosis not present

## 2021-07-08 DIAGNOSIS — R42 Dizziness and giddiness: Secondary | ICD-10-CM | POA: Diagnosis not present

## 2021-07-08 DIAGNOSIS — F329 Major depressive disorder, single episode, unspecified: Secondary | ICD-10-CM | POA: Diagnosis not present

## 2021-07-08 DIAGNOSIS — I10 Essential (primary) hypertension: Secondary | ICD-10-CM | POA: Diagnosis not present

## 2021-07-09 ENCOUNTER — Ambulatory Visit
Admission: RE | Admit: 2021-07-09 | Discharge: 2021-07-09 | Disposition: A | Discharge status: Home / Self Care | Payer: Medicare Other | Source: Ambulatory Visit | Attending: Family Medicine | Admitting: Family Medicine

## 2021-07-09 DIAGNOSIS — Z1231 Encounter for screening mammogram for malignant neoplasm of breast: Secondary | ICD-10-CM | POA: Diagnosis not present

## 2021-07-23 ENCOUNTER — Ambulatory Visit
Admission: RE | Admit: 2021-07-23 | Discharge: 2021-07-23 | Disposition: A | Discharge status: Home / Self Care | Payer: Medicare Other | Source: Ambulatory Visit | Attending: Pulmonary Disease | Admitting: Pulmonary Disease

## 2021-07-23 DIAGNOSIS — I700 Atherosclerosis of aorta: Secondary | ICD-10-CM | POA: Diagnosis not present

## 2021-07-23 DIAGNOSIS — I2510 Atherosclerotic heart disease of native coronary artery without angina pectoris: Secondary | ICD-10-CM | POA: Diagnosis not present

## 2021-07-23 DIAGNOSIS — R918 Other nonspecific abnormal finding of lung field: Secondary | ICD-10-CM | POA: Diagnosis not present

## 2021-07-23 DIAGNOSIS — J432 Centrilobular emphysema: Secondary | ICD-10-CM | POA: Diagnosis not present

## 2021-07-24 DIAGNOSIS — H25013 Cortical age-related cataract, bilateral: Secondary | ICD-10-CM | POA: Diagnosis not present

## 2021-07-24 DIAGNOSIS — H01115 Allergic dermatitis of left lower eyelid: Secondary | ICD-10-CM | POA: Diagnosis not present

## 2021-07-24 DIAGNOSIS — H01111 Allergic dermatitis of right upper eyelid: Secondary | ICD-10-CM | POA: Diagnosis not present

## 2021-07-24 DIAGNOSIS — H11433 Conjunctival hyperemia, bilateral: Secondary | ICD-10-CM | POA: Diagnosis not present

## 2021-09-05 DIAGNOSIS — M25511 Pain in right shoulder: Secondary | ICD-10-CM | POA: Diagnosis not present

## 2021-09-05 DIAGNOSIS — M25521 Pain in right elbow: Secondary | ICD-10-CM | POA: Diagnosis not present

## 2021-09-10 DIAGNOSIS — Z09 Encounter for follow-up examination after completed treatment for conditions other than malignant neoplasm: Secondary | ICD-10-CM | POA: Diagnosis not present

## 2021-09-10 DIAGNOSIS — K648 Other hemorrhoids: Secondary | ICD-10-CM | POA: Diagnosis not present

## 2021-09-10 DIAGNOSIS — Z86010 Personal history of colonic polyps: Secondary | ICD-10-CM | POA: Diagnosis not present

## 2021-09-10 DIAGNOSIS — D125 Benign neoplasm of sigmoid colon: Secondary | ICD-10-CM | POA: Diagnosis not present

## 2021-09-10 DIAGNOSIS — D123 Benign neoplasm of transverse colon: Secondary | ICD-10-CM | POA: Diagnosis not present

## 2021-09-12 DIAGNOSIS — D125 Benign neoplasm of sigmoid colon: Secondary | ICD-10-CM | POA: Diagnosis not present

## 2021-09-12 DIAGNOSIS — D123 Benign neoplasm of transverse colon: Secondary | ICD-10-CM | POA: Diagnosis not present

## 2021-10-06 ENCOUNTER — Telehealth: Payer: Self-pay | Admitting: Gastroenterology

## 2021-10-06 NOTE — Telephone Encounter (Signed)
Urgent referral in [place] for EMR of a tubular adenoma that was not completely removed but was clipped and tattooed.  Please advise scheduling with Dr. Ardis Hughs or Dr. Rush Landmark

## 2021-10-06 NOTE — Telephone Encounter (Signed)
I need to see the referral packet. Within the referral packet I need to see color images of the colonoscopy report. Within the referral packet I need to see the pathology report. Please let the referring provider's office ([facility]) and Dr. Michail Sermon know that I am booking for September and potentially October at this point in time and so if they feel that there is a more urgent need for this colonoscopy repeat to be performed that they may need to refer this out to one of the quaternary centers. Once I have received the packet with this information we can decide upon clinic visit and colonoscopy rescheduling. Please work on getting these as soon as able because next week I will not be in the office significantly. Thanks. GM

## 2021-11-03 ENCOUNTER — Telehealth: Payer: Self-pay | Admitting: Gastroenterology

## 2021-11-03 NOTE — Telephone Encounter (Signed)
Hi Dr. Rush Landmark,  We received records from [place] for this patient.  Dr. 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  Her records will be forwarded to you for your review.  Please let me know how you would like to proceed.  Thank you.

## 2021-11-03 NOTE — Telephone Encounter (Signed)
Please see prior notation from July. I needed to see the records and the color images but I never received those.  Hopefully we have those so that I can review and decide upon attempt and availability of attempt at resection.  If they are not in the packet, please work on receiving those ASAP. Thanks. GM

## 2021-11-03 NOTE — Telephone Encounter (Signed)
Reviewed the packet that was on my desk. Color images were not present.  Colonoscopy pathology Hepatic flexure polyp tubular adenoma. Sigmoid colon polyp tubular adenoma.  June 2023 colonoscopy fecal GI Two 5 to 7 mm polyps at the hepatic flexure, removed with a hot snare.  Resected and retrieved. 1, 20 mm polyp at the hepatic flexure, removed with a hot snare.  Incomplete resection.  Resected tissue retrieved.  Tattooed.  Clips were placed. 1, 14 mm polyp in the sigmoid colon, removed with a hot snare.  Resected and retrieved. Internal hemorrhoids.   Would like to have the color images available.  Please work on obtaining those.  Patient can be set up for a clinic visit to discuss colonoscopy with advanced resection attempt of the incompletely removed polyp (need for FTRD versus avulsion versus EMR versus Endorotor).  Patient can be offered a colonoscopy with EMR slot no sooner than September so that the previous clips and area have healed ideally would be between October for healing to occur and for [place] to see what things look like.  If the patient wants to wait on scheduling procedure visit as well that is okay.  These records will be scanned into the chart.  Please update Dr. Michail Sermon and I went she is scheduled.   Justice Britain, MD [facility] Advanced Endoscopy Office # [phone]

## 2021-11-04 NOTE — Telephone Encounter (Signed)
Left message on machine to call back  

## 2021-11-05 NOTE — Telephone Encounter (Signed)
Left message on machine to call back  

## 2021-11-06 NOTE — Telephone Encounter (Signed)
Left message on machine to call back  

## 2021-11-07 NOTE — Telephone Encounter (Signed)
I have attempted to reach the pt on 4 separate occasions. I left messages to have her reach out to our office.  FYI

## 2021-11-11 NOTE — Telephone Encounter (Signed)
Letter has been mailed to the pt to contact our office to make appt if she wishes.

## 2021-11-11 NOTE — Telephone Encounter (Signed)
Patty, Thanks for trying to reach the patient. You can send a letter for consideration of a clinic visit. I have put Dr. Michail Sermon on here so that he is aware also. Hopefully she will call back. GM  FYI VS about patient referral.

## 2021-11-13 ENCOUNTER — Encounter: Payer: Self-pay | Admitting: Gastroenterology

## 2022-01-14 ENCOUNTER — Encounter: Payer: Self-pay | Admitting: Gastroenterology

## 2022-01-14 ENCOUNTER — Ambulatory Visit: Payer: Medicare Other | Admitting: Gastroenterology

## 2022-01-14 ENCOUNTER — Other Ambulatory Visit (INDEPENDENT_AMBULATORY_CARE_PROVIDER_SITE_OTHER): Payer: Medicare Other

## 2022-01-14 VITALS — BP 128/68 | HR 75 | Ht 63.0 in | Wt 129.6 lb

## 2022-01-14 DIAGNOSIS — Z86010 Personal history of colonic polyps: Secondary | ICD-10-CM

## 2022-01-14 DIAGNOSIS — D369 Benign neoplasm, unspecified site: Secondary | ICD-10-CM

## 2022-01-14 DIAGNOSIS — R933 Abnormal findings on diagnostic imaging of other parts of digestive tract: Secondary | ICD-10-CM | POA: Diagnosis not present

## 2022-01-14 DIAGNOSIS — D123 Benign neoplasm of transverse colon: Secondary | ICD-10-CM | POA: Diagnosis not present

## 2022-01-14 LAB — BASIC METABOLIC PANEL
BUN: 21 mg/dL (ref 6–23)
CO2: 25 mEq/L (ref 19–32)
Calcium: 9.6 mg/dL (ref 8.4–10.5)
Chloride: 103 mEq/L (ref 96–112)
Creatinine, Ser: 1.4 mg/dL — ABNORMAL HIGH (ref 0.40–1.20)
GFR: 38.71 mL/min — ABNORMAL LOW (ref >60.00)
Glucose, Bld: 112 mg/dL — ABNORMAL HIGH (ref 70–99)
Potassium: 4.6 mEq/L (ref 3.5–5.1)
Sodium: 137 mEq/L (ref 135–145)

## 2022-01-14 LAB — CBC
HCT: 38.5 % (ref 36.0–46.0)
Hemoglobin: 12.9 g/dL (ref 12.0–15.0)
MCHC: 33.4 g/dL (ref 30.0–36.0)
MCV: 102.5 fl — ABNORMAL HIGH (ref 78.0–100.0)
Platelets: 350 10*3/uL (ref 150.0–400.0)
RBC: 3.76 Mil/uL — ABNORMAL LOW (ref 3.87–5.11)
RDW: 13.2 % (ref 11.5–15.5)
WBC: 6.7 10*3/uL (ref 4.0–10.5)

## 2022-01-14 LAB — PROTIME-INR
INR: 0.9 ratio (ref 0.8–1.0)
Prothrombin Time: 9.9 s (ref 9.6–13.1)

## 2022-01-14 NOTE — Patient Instructions (Signed)
It has been recommended to you by your physician that you have a(n) Colon +EMR  completed. Per your request, we did not schedule the procedure(s) today. Please contact our office at [phone] in November to schedule for January 2024. You may ask to speak with Kristia Jupiter- Dr. Donneta Romberg CMA    Your provider has requested that you go to the basement level for lab work before leaving today. Press "B" on the elevator. The lab is located at the first door on the left as you exit the elevator.  _______________________________________________________  If you are age 68 or older, your body mass index should be between 23-30. Your Body mass index is 22.96 kg/m. If this is out of the aforementioned range listed, please consider follow up with your Primary Care Provider.  If you are age 66 or younger, your body mass index should be between 19-25. Your Body mass index is 22.96 kg/m. If this is out of the aformentioned range listed, please consider follow up with your Primary Care Provider.   ________________________________________________________  The [facility] providers would like to encourage you to use [facility] to communicate with providers for non-urgent requests or questions.  Due to long hold times on the telephone, sending your provider a message by [facility] may be a faster and more efficient way to get a response.  Please allow 48 business hours for a response.  Please remember that this is for non-urgent requests.  _______________________________________________________  Thank you for choosing me and [facility].  Dr. Rush Landmark

## 2022-01-15 ENCOUNTER — Encounter: Payer: Self-pay | Admitting: Gastroenterology

## 2022-01-15 DIAGNOSIS — R933 Abnormal findings on diagnostic imaging of other parts of digestive tract: Secondary | ICD-10-CM | POA: Insufficient documentation

## 2022-01-15 DIAGNOSIS — Z86010 Personal history of colonic polyps: Secondary | ICD-10-CM | POA: Insufficient documentation

## 2022-01-15 DIAGNOSIS — D123 Benign neoplasm of transverse colon: Secondary | ICD-10-CM | POA: Insufficient documentation

## 2022-01-15 NOTE — Progress Notes (Signed)
[place] VISIT   Primary Care Provider Glenis Smoker, MD [place]  [id number] [phone]  Referring Provider Dr. Michail Sermon  Patient Profile: Laura Hayes is a 66 y.o. female with a pmh significant for COPD, hypertension, hyperlipidemia, nephrolithiasis, MDD, GERD, colon polyps (TA's and hepatic flexure TA incomplete resection).  The patient presents to the [facility] for an evaluation and management of problem(s) noted below:  Problem List 1. 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 Hx of adenomatous colonic polyps   3. Abnormal colonoscopy     History of Present Illness This is the patient's first visit to the outpatient [place] clinic.  Earlier this summer she underwent her surveillance colonoscopy with Dr. Michail Sermon.  He found multiple polyps including a hepatic flexure polyp that he attempted removal but was an incomplete resection unfortunately.  She was referred this summer to me but deferred on scheduling a clinic visit.  She finally rescheduled and is now being seen today.  Patient denies any significant changes in her bowel habits or any other issues in her previous colonoscopies have been done with Dr. Michail Sermon in the past.  She is not having any other GI issues at this time other than her GERD symptoms which are well controlled.  She denies any significant dysphagia symptoms.  She understands that she has a precancerous polyp that still remains in place and is willing to move forward with evaluation and wants to try to stave off surgery if possible.  GI Review of Systems Positive as above Negative for odynophagia, nausea, vomiting, pain, alteration of bowel habits, melena, hematochezia  Review of Systems General: Denies fevers/chills/weight loss unintentionally Cardiovascular: Denies chest pain Pulmonary: Denies shortness of breath Gastroenterological: See HPI Genitourinary: Denies  darkened urine Hematological: Denies easy bruising/bleeding Dermatological: Denies jaundice Psychological: Mood is stable and   Medications Current Outpatient Medications  Medication Sig Dispense Refill   acetaminophen (TYLENOL) 325 MG tablet Take 650 mg by mouth every 6 (six) hours as needed.     aspirin 81 MG EC tablet Take 81 mg by mouth daily.     buPROPion (WELLBUTRIN XL) 300 MG 24 hr tablet Take 300 mg by mouth daily.     calcium carbonate (TUMS - DOSED IN MG ELEMENTAL CALCIUM) 500 MG chewable tablet Chew 1 tablet by mouth daily as needed for indigestion or heartburn.     Cholecalciferol (VITAMIN D3) 50 MCG (2000 UT) TABS Take by mouth.     ibuprofen (ADVIL,MOTRIN) 200 MG tablet Take 200 mg by mouth every 6 (six) hours as needed.     losartan (COZAAR) 100 MG tablet Take 1 tablet by mouth daily.     rosuvastatin (CRESTOR) 20 MG tablet Take 20 mg by mouth daily.     sertraline (ZOLOFT) 100 MG tablet Take 100 mg by mouth daily.     albuterol (VENTOLIN HFA) 108 (90 Base) MCG/ACT inhaler Inhale 2 puffs into the lungs every 6 (six) hours as needed for wheezing or shortness of breath. (Patient not taking: Reported on 01/14/2022) 8 g 6   Tiotropium Bromide Monohydrate (SPIRIVA RESPIMAT) 1.25 MCG/ACT AERS Inhale 2 puffs into the lungs daily. (Patient not taking: Reported on 01/14/2022) 3 each 3   verapamil (CALAN-SR) 180 MG CR tablet   0   No current facility-administered medications for this visit.    Allergies Allergies  Allergen Reactions   Lisinopril Cough    Histories Past Medical History:  Diagnosis Date  Agatston coronary artery calcium score between 200 and 399 09/2016   Coronary calcium score 172.  Mid LAD <50% & mid RCA 30% calcified lesions.  Nonflow limiting.;;  April 2021-calcium score now increased to 2 and 39.  Scattered aortic valve and ascending aortic calcification noted.   Aortic calcification (HCC)    Cluster headache    Colon polyps    COPD (chronic  obstructive pulmonary disease) (HCC)    Depression    Essential hypertension    GERD (gastroesophageal reflux disease)    High blood pressure    Hypercholesteremia    Kidney stones 2015   Vitamin D deficiency    Past Surgical History:  Procedure Laterality Date   CESAREAN SECTION     HAND SURGERY Right    Social History   Socioeconomic History   Marital status: Married    Spouse name: Not on file   Number of children: 2   Years of education: 13   Highest education level: Not on file  Occupational History   Occupation: Photographer   Occupation: retired  Tobacco Use   Smoking status: Former    Types: E-cigarettes    Quit date: 03/06/2015    Years since quitting: 6.8   Smokeless tobacco: Never  Substance and Sexual Activity   Alcohol use: Yes    Alcohol/week: 1.0 standard drink of alcohol    Types: 1 Standard drinks or equivalent per week    Comment: Seldom - social   Drug use: No    Comment: past hx of marijuana use in 70's   Sexual activity: Yes  Other Topics Concern   Not on file  Social History Narrative   Lives at home with husband.   Had 3 total children, 2 living.  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   Right-handed.   2 liter caffeine daily (diet soda).   Social Determinants of Health   Financial Resource Strain: Not on file  Food Insecurity: Not on file  Transportation Needs: Not on file  Physical Activity: Not on file  Stress: Not on file  Social Connections: Not on file  Intimate Partner Violence: Not on file   Family History  Problem Relation Age of Onset   Migraines Mother    Transient ischemic attack Mother    Dementia Mother    Hypertension Mother    Hyperlipidemia Mother    Colon polyps Mother    Lung disease Father    Hypertension Father    Emphysema Father    COPD Father    Coronary artery disease Father        s/p PCI, CABG   Heart disease Father    Hypertension Brother    Heart  attack Brother    Stroke Brother    Stroke Maternal Grandmother    Heart attack Maternal Grandfather    Heart attack Paternal Grandfather    Other Son        Tricuspid atresa with VSD   Breast cancer Neg Hx    Colon cancer Neg Hx    Esophageal cancer Neg Hx    Inflammatory bowel disease Neg Hx    Liver disease Neg Hx    Pancreatic cancer Neg Hx    Rectal cancer Neg Hx    Stomach cancer Neg Hx    I have reviewed her medical, social, and family history in detail and updated the electronic medical record as necessary.    PHYSICAL EXAMINATION  BP 128/68  Pulse 75   Ht '5\' 3"'$  (1.6 m)   Wt 129 lb 9.6 oz (58.8 kg)   SpO2 96%   BMI 22.96 kg/m  Wt Readings from Last 3 Encounters:  01/14/22 129 lb 9.6 oz (58.8 kg)  05/09/21 129 lb 3.2 oz (58.6 kg)  03/20/21 123 lb 6.4 oz (56 kg)  GEN: NAD, appears stated age, doesn't appear chronically ill PSYCH: Cooperative, without pressured speech EYE: Conjunctivae pink, sclerae anicteric ENT: MMM CV: Nontachycardic RESP: No audible wheezing GI: NABS, soft, NT/ND, without rebound MSK/EXT: No lower extremity edema SKIN: No jaundice NEURO:  Alert & Oriented x 3, no focal deficits   REVIEW OF DATA  I reviewed the following data at the time of this encounter:  GI Procedures and Studies  June 2023 colonoscopy [facility] Two 5 to 7 mm polyps at the hepatic flexure-removed with hot snare.  Resected and retrieved. 1, 20 mm polyp (carpet like) at the hepatic flexure, removed with a hot snare-incomplete resection unfortunately with hemostatic clip placement and tattoo and epinephrine 1, 14 mm polyp in the sigmoid colon-removed with hot snare.  Resected and retrieved  Pathology Hepatic flexure polyp tubular adenoma Sigmoid colon polyp tubular adenoma  Laboratory Studies  Reviewed those in epic  Imaging Studies  No relevant studies to review   ASSESSMENT  Ms. Fariss is a 66 y.o. female  with a pmh significant for COPD, hypertension,  hyperlipidemia, nephrolithiasis, MDD, GERD, colon polyps (TA's and hepatic flexure TA incomplete resection).  The patient is seen today for evaluation and management of:  1. 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 Hx of adenomatous colonic polyps   3. Abnormal colonoscopy    The patient is hemodynamically and clinically stable.  Based upon the description and endoscopic pictures I do feel that it is reasonable to pursue an Advanced Polypectomy attempt of the polyp/lesion.  We discussed some of the techniques of advanced polypectomy which include Endoscopic Mucosal Resection, OVESCO Full-Thickness Resection, Endorotor Morcellation, and Tissue Ablation via Fulguration.  We also reviewed images of typical techniques as noted above.  Suspect Endorotor or FTR D will be more likely due to scarring that has probably occurred from the previous resection.  The risks and benefits of endoscopic evaluation were discussed with the patient; these include but are not limited to the risk of perforation, infection, bleeding, missed lesions, lack of diagnosis, severe illness requiring hospitalization, as well as anesthesia and sedation related illnesses.  During attempts at advanced resection, the risks of bleeding and perforation/leak are increased as opposed to diagnostic and screening procedures, and that was discussed with the patient as well.   In addition, I explained that with the possible need for piecemeal resection, subsequent short-interval endoscopic evaluation for follow up and potential retreatment of the lesion/area may be necessary.  I did offer, a referral to surgery in order for patient to have opportunity to discuss surgical management/intervention prior to finalizing decision for attempt at endoscopic removal, however, the patient deferred on this.  If, after attempt at removal of the polyp/lesion, it is found that the patient has a complication or that an invasive lesion or malignant lesion is found, or  that the polyp/lesion continues to recur, the patient is aware and understands that surgery may still be indicated/required.  All patient questions were answered, to the best of my ability, and the patient agrees to the aforementioned plan of action with follow-up as indicated.   PLAN  Preprocedure labs to be obtained  Proceed with scheduling colonoscopy with advanced polypectomy reattempt - Patient wants to wait until January so she will be scheduled when that has been opened Follow-up to be dictated based on results of colonoscopy   Orders Placed This Encounter  Procedures   CBC   Basic Metabolic Panel (BMET)   INR/PT    New Prescriptions   No medications on file   Modified Medications   No medications on file    Planned Follow Up No follow-ups on file.   Total Time in Face-to-Face and in Coordination of Care for patient including independent/personal interpretation/review of prior testing, medical history, examination, medication adjustment, communicating results with the patient directly, and documentation within the EHR is 45 minutes.   Justice Britain, MD [facility] Office # [phone]

## 2022-01-18 ENCOUNTER — Encounter: Payer: Self-pay | Admitting: Gastroenterology

## 2022-01-18 NOTE — Progress Notes (Signed)
April 2023 outside records  WBC 6.5 Hemoglobin/hematocrit 13.4/38.9 321 MCV 5 Sodium 137 Potassium 4.9 BUN/creatinine 20/1.12 AST/ALT 19/18 Alk phos 110 T. bili 0.4

## 2022-01-20 DIAGNOSIS — Z0000 Encounter for general adult medical examination without abnormal findings: Secondary | ICD-10-CM | POA: Diagnosis not present

## 2022-01-20 DIAGNOSIS — Z23 Encounter for immunization: Secondary | ICD-10-CM | POA: Diagnosis not present

## 2022-01-20 DIAGNOSIS — F329 Major depressive disorder, single episode, unspecified: Secondary | ICD-10-CM | POA: Diagnosis not present

## 2022-01-20 DIAGNOSIS — I10 Essential (primary) hypertension: Secondary | ICD-10-CM | POA: Diagnosis not present

## 2022-01-20 DIAGNOSIS — E7800 Pure hypercholesterolemia, unspecified: Secondary | ICD-10-CM | POA: Diagnosis not present

## 2022-01-20 DIAGNOSIS — R7301 Impaired fasting glucose: Secondary | ICD-10-CM | POA: Diagnosis not present

## 2022-01-27 ENCOUNTER — Encounter: Payer: Self-pay | Admitting: Gastroenterology

## 2022-01-27 NOTE — Progress Notes (Signed)
January 20, 2022 outside labs Sodium 138 Potassium 4.4 BUN/creatinine 15/1.09 AST/ALT 18/16 Alk phos 103 T. bili 0.4

## 2022-01-28 DIAGNOSIS — M25511 Pain in right shoulder: Secondary | ICD-10-CM | POA: Diagnosis not present

## 2022-02-17 DIAGNOSIS — H524 Presbyopia: Secondary | ICD-10-CM | POA: Diagnosis not present

## 2022-04-20 ENCOUNTER — Other Ambulatory Visit: Payer: Self-pay

## 2022-04-20 ENCOUNTER — Telehealth: Payer: Self-pay

## 2022-04-20 DIAGNOSIS — R933 Abnormal findings on diagnostic imaging of other parts of digestive tract: Secondary | ICD-10-CM

## 2022-04-20 DIAGNOSIS — D123 Benign neoplasm of transverse colon: Secondary | ICD-10-CM

## 2022-04-20 MED ORDER — NA SULFATE-K SULFATE-MG SULF 17.5-3.13-1.6 GM/177ML PO SOLN: 1.0000 | Freq: Once | ORAL | 0 refills | Status: AC

## 2022-04-20 NOTE — Telephone Encounter (Signed)
Colon EMR has been scheduled for 06/17/22 at 11 am with GM at [facility]   Left message on machine to call back

## 2022-04-20 NOTE — Telephone Encounter (Signed)
-----  Message from Irving Copas., MD sent at 04/19/2022  3:38 AM EST ----- Regarding: Follow-up This patient was to be scheduled for January recall.  Can we see about getting her on the list for colonoscopy with EMR? Thanks. GM

## 2022-04-21 NOTE — Telephone Encounter (Signed)
Colon EMR  scheduled, pt instructed and medications reviewed.  Patient instructions mailed to home.  Patient to call with any questions or concerns.

## 2022-04-21 NOTE — Telephone Encounter (Signed)
Left message on machine to call back   All instructions have been mailed to the pt

## 2022-06-08 ENCOUNTER — Ambulatory Visit: Payer: Medicare Other | Admitting: Adult Health

## 2022-06-08 ENCOUNTER — Encounter: Payer: Self-pay | Admitting: Adult Health

## 2022-06-08 VITALS — BP 126/70 | HR 83 | Temp 98.3°F | Ht 63.0 in | Wt 125.2 lb

## 2022-06-08 DIAGNOSIS — J439 Emphysema, unspecified: Secondary | ICD-10-CM

## 2022-06-08 DIAGNOSIS — Z87891 Personal history of nicotine dependence: Secondary | ICD-10-CM | POA: Diagnosis not present

## 2022-06-08 DIAGNOSIS — R918 Other nonspecific abnormal finding of lung field: Secondary | ICD-10-CM | POA: Diagnosis not present

## 2022-06-08 NOTE — Assessment & Plan Note (Signed)
Stable lung nodules on CT scan.  Continue with yearly CT chest part of the lung cancer screening program.

## 2022-06-08 NOTE — Progress Notes (Signed)
Flu shot in PCP office in Sept/Oct of 2023.

## 2022-06-08 NOTE — Assessment & Plan Note (Addendum)
Emphysema noted on CT scan.  Patient has no significant symptoms.  Did not have any perceived benefit on Spiriva or albuterol. Continue to keep immunizations up-to-date.  Plan  Patient Instructions  Refer to Lung cancer CT screening program.  Activity as tolerated  Albuterol inhaler As needed   Check on pneumonia vaccine with Primary MD .  Follow with Dr. Valeta Harms in 1 year and As needed

## 2022-06-08 NOTE — Patient Instructions (Addendum)
Refer to Lung cancer CT screening program.  Activity as tolerated  Albuterol inhaler As needed   Check on pneumonia vaccine with Primary MD .  Follow with Dr. Valeta Harms in 1 year and As needed

## 2022-06-08 NOTE — Progress Notes (Signed)
@Patient  ID: Laura Hayes, female    DOB: 1955/04/07, 67 y.o.   MRN: [id number]  Chief Complaint  Patient presents with   Follow-up    Referring provider: Glenis Smoker, *  HPI: 67 year old female former smoker followed for lung nodules  TEST/EVENTS :  CT chest Jul 23, 2021 shows moderate emphysema, bilateral nodular densities likely represent atelectasis, stable 6.1 mm right lower lobe nodule-lung RADS 2 benign appearance  06/08/2022 Follow up : Lung nodules  Patient returns from 1 year follow-up.  Patient is a former smoker.  Participates in the lung cancer screening program.  CT chest showed moderate emphysema.  PFT showed normal lung function.  Patient was tried on Spiriva and albuterol but says that she did not need them.  She says she is very active has no significant shortness of breath.  Has no albuterol use.  She denies any flare of cough or wheezing.  She denies any hemoptysis discolored mucus.  She says she exercises on a routine basis.  Has joined the [employer].  Also is getting ready to try chair yoga.  She says she is fully independent drives does all of her shopping and cleaning.  Most recent CT chest was in May 2023 that showed stable nodular densities considered a lung RADS 2.  We discussed reenrollment in the lung cancer screening program for upcoming in May 2024    Allergies  Allergen Reactions   Lisinopril Cough    Immunization History  Administered Date(s) Administered   Influenza,inj,Quad PF,6+ Mos 04/02/2016   PFIZER(Purple Top)SARS-COV-2 Vaccination 04/27/2019, 05/18/2019   Pneumococcal Polysaccharide-23 04/01/2011    Past Medical History:  Diagnosis Date   Agatston coronary artery calcium score between 200 and 399 09/2016   Coronary calcium score 172.  Mid LAD <50% & mid RCA 30% calcified lesions.  Nonflow limiting.;;  April 2021-calcium score now increased to 2 and 39.  Scattered aortic valve and ascending aortic calcification  noted.   Aortic calcification (HCC)    Cluster headache    Colon polyps    COPD (chronic obstructive pulmonary disease) (HCC)    Depression    Essential hypertension    GERD (gastroesophageal reflux disease)    High blood pressure    Hypercholesteremia    Kidney stones 2015   Vitamin D deficiency     Tobacco History: Social History   Tobacco Use  Smoking Status Former   Types: E-cigarettes   Quit date: 03/06/2015   Years since quitting: 7.2  Smokeless Tobacco Never   Counseling given: Not Answered   Outpatient Medications Prior to Visit  Medication Sig Dispense Refill   acetaminophen (TYLENOL) 325 MG tablet Take 650 mg by mouth every 6 (six) hours as needed.     aspirin 81 MG EC tablet Take 81 mg by mouth daily.     buPROPion (WELLBUTRIN XL) 300 MG 24 hr tablet Take 300 mg by mouth daily.     calcium carbonate (TUMS - DOSED IN MG ELEMENTAL CALCIUM) 500 MG chewable tablet Chew 1 tablet by mouth daily as needed for indigestion or heartburn.     Cholecalciferol (VITAMIN D3) 50 MCG (2000 UT) TABS Take by mouth.     ibuprofen (ADVIL,MOTRIN) 200 MG tablet Take 200 mg by mouth every 6 (six) hours as needed.     losartan (COZAAR) 100 MG tablet Take 1 tablet by mouth daily.     rosuvastatin (CRESTOR) 20 MG tablet Take 20 mg by mouth daily.  sertraline (ZOLOFT) 100 MG tablet Take 100 mg by mouth daily.     verapamil (CALAN-SR) 180 MG CR tablet   0   albuterol (VENTOLIN HFA) 108 (90 Base) MCG/ACT inhaler Inhale 2 puffs into the lungs every 6 (six) hours as needed for wheezing or shortness of breath. (Patient not taking: Reported on 01/14/2022) 8 g 6   Tiotropium Bromide Monohydrate (SPIRIVA RESPIMAT) 1.25 MCG/ACT AERS Inhale 2 puffs into the lungs daily. (Patient not taking: Reported on 01/14/2022) 3 each 3   No facility-administered medications prior to visit.     Review of Systems:   Constitutional:   No  weight loss, night sweats,  Fevers, chills, fatigue, or   lassitude.  HEENT:   No headaches,  Difficulty swallowing,  Tooth/dental problems, or  Sore throat,                No sneezing, itching, ear ache, nasal congestion, post nasal drip,   CV:  No chest pain,  Orthopnea, PND, swelling in lower extremities, anasarca, dizziness, palpitations, syncope.   GI  No heartburn, indigestion, abdominal pain, nausea, vomiting, diarrhea, change in bowel habits, loss of appetite, bloody stools.   Resp: No shortness of breath with exertion or at rest.  No excess mucus, no productive cough,  No non-productive cough,  No coughing up of blood.  No change in color of mucus.  No wheezing.  No chest wall deformity  Skin: no rash or lesions.  GU: no dysuria, change in color of urine, no urgency or frequency.  No flank pain, no hematuria   MS:  No joint pain or swelling.  No decreased range of motion.  No back pain.    Physical Exam  BP 126/70 (BP Location: Left Arm, Patient Position: Sitting, Cuff Size: Normal)   Pulse 83   Temp 98.3 F (36.8 C) (Oral)   Ht 5\' 3"  (1.6 m)   Wt 125 lb 3.2 oz (56.8 kg)   SpO2 97%   BMI 22.18 kg/m   GEN: A/Ox3; pleasant , NAD, well nourished    HEENT:  [place]/AT,  NOSE-clear, THROAT-clear, no lesions, no postnasal drip or exudate noted.   NECK:  Supple w/ fair ROM; no JVD; normal carotid impulses w/o bruits; no thyromegaly or nodules palpated; no lymphadenopathy.    RESP  Clear  P & A; w/o, wheezes/ rales/ or rhonchi. no accessory muscle use, no dullness to percussion  CARD:  RRR, no m/r/g, no peripheral edema, pulses intact, no cyanosis or clubbing.  GI:   Soft & nt; nml bowel sounds; no organomegaly or masses detected.   Musco: Warm bil, no deformities or joint swelling noted.   Neuro: alert, no focal deficits noted.    Skin: Warm, no lesions or rashes    Lab Results:    BNP No results found for: "BNP"  ProBNP No results found for: "PROBNP"  Imaging: No results found.       Latest Ref Rng & Units  05/09/2021   12:36 PM 03/24/2013    3:35 PM  PFT Results  FVC-Pre L 3.41  3.22   FVC-Predicted Pre % 114  100   FVC-Post L 3.55  3.29   FVC-Predicted Post % 119  102   Pre FEV1/FVC % % 73  72   Post FEV1/FCV % % 71  73   FEV1-Pre L 2.48  2.33   FEV1-Predicted Pre % 109  93   FEV1-Post L 2.53  2.39   DLCO uncorrected ml/min/mmHg 15.73  17.60   DLCO UNC% % 82  76   DLCO corrected ml/min/mmHg 15.73    DLCO COR %Predicted % 82    DLVA Predicted % 74  76   TLC L 6.00  5.40   TLC % Predicted % 122  110   RV % Predicted % 129  113     No results found for: "NITRICOXIDE"      Assessment & Plan:   Emphysema lung (HCC) Emphysema noted on CT scan.  Patient has no significant symptoms.  Did not have any perceived benefit on Spiriva or albuterol. Continue to keep immunizations up-to-date.  Plan  Patient Instructions  Refer to Lung cancer CT screening program.  Activity as tolerated  Albuterol inhaler As needed   Check on pneumonia vaccine with Primary MD .  Follow with Dr. Valeta Harms in 1 year and As needed      Lung nodules Stable lung nodules on CT scan.  Continue with yearly CT chest part of the lung cancer screening program.     Rexene Edison, NP 06/08/2022

## 2022-06-10 ENCOUNTER — Encounter (HOSPITAL_COMMUNITY): Payer: Self-pay | Admitting: Gastroenterology

## 2022-06-10 NOTE — Progress Notes (Signed)
Attempted to obtain medical history via telephone, unable to reach at this time. HIPAA compliant voicemail message left requesting return call to pre surgical testing department. 

## 2022-06-11 ENCOUNTER — Encounter: Payer: Self-pay | Admitting: Gastroenterology

## 2022-06-11 DIAGNOSIS — D485 Neoplasm of uncertain behavior of skin: Secondary | ICD-10-CM | POA: Diagnosis not present

## 2022-06-11 DIAGNOSIS — D225 Melanocytic nevi of trunk: Secondary | ICD-10-CM | POA: Diagnosis not present

## 2022-06-11 DIAGNOSIS — L573 Poikiloderma of Civatte: Secondary | ICD-10-CM | POA: Diagnosis not present

## 2022-06-11 DIAGNOSIS — L814 Other melanin hyperpigmentation: Secondary | ICD-10-CM | POA: Diagnosis not present

## 2022-06-11 DIAGNOSIS — L821 Other seborrheic keratosis: Secondary | ICD-10-CM | POA: Diagnosis not present

## 2022-06-11 DIAGNOSIS — L570 Actinic keratosis: Secondary | ICD-10-CM | POA: Diagnosis not present

## 2022-06-11 DIAGNOSIS — L989 Disorder of the skin and subcutaneous tissue, unspecified: Secondary | ICD-10-CM | POA: Diagnosis not present

## 2022-06-17 ENCOUNTER — Other Ambulatory Visit: Payer: Self-pay

## 2022-06-17 ENCOUNTER — Ambulatory Visit (HOSPITAL_BASED_OUTPATIENT_CLINIC_OR_DEPARTMENT_OTHER): Payer: Medicare Other | Admitting: Anesthesiology

## 2022-06-17 ENCOUNTER — Ambulatory Visit (HOSPITAL_COMMUNITY)
Admission: RE | Admit: 2022-06-17 | Discharge: 2022-06-17 | Disposition: A | Discharge status: Home / Self Care | Payer: Medicare Other | Attending: Gastroenterology | Admitting: Gastroenterology

## 2022-06-17 ENCOUNTER — Ambulatory Visit (HOSPITAL_COMMUNITY): Payer: Medicare Other | Admitting: Anesthesiology

## 2022-06-17 ENCOUNTER — Encounter (HOSPITAL_COMMUNITY)
Admission: RE | Disposition: A | Discharge status: Home / Self Care | Payer: Self-pay | Source: Home / Self Care | Attending: Gastroenterology

## 2022-06-17 ENCOUNTER — Encounter (HOSPITAL_COMMUNITY): Payer: Self-pay | Admitting: Gastroenterology

## 2022-06-17 DIAGNOSIS — Z87891 Personal history of nicotine dependence: Secondary | ICD-10-CM | POA: Insufficient documentation

## 2022-06-17 DIAGNOSIS — K644 Residual hemorrhoidal skin tags: Secondary | ICD-10-CM | POA: Diagnosis not present

## 2022-06-17 DIAGNOSIS — K641 Second degree hemorrhoids: Secondary | ICD-10-CM | POA: Diagnosis not present

## 2022-06-17 DIAGNOSIS — K648 Other hemorrhoids: Secondary | ICD-10-CM

## 2022-06-17 DIAGNOSIS — J449 Chronic obstructive pulmonary disease, unspecified: Secondary | ICD-10-CM | POA: Diagnosis not present

## 2022-06-17 DIAGNOSIS — D120 Benign neoplasm of cecum: Secondary | ICD-10-CM | POA: Insufficient documentation

## 2022-06-17 DIAGNOSIS — I2510 Atherosclerotic heart disease of native coronary artery without angina pectoris: Secondary | ICD-10-CM | POA: Diagnosis not present

## 2022-06-17 DIAGNOSIS — K635 Polyp of colon: Secondary | ICD-10-CM | POA: Diagnosis not present

## 2022-06-17 DIAGNOSIS — D123 Benign neoplasm of transverse colon: Secondary | ICD-10-CM

## 2022-06-17 DIAGNOSIS — Z79899 Other long term (current) drug therapy: Secondary | ICD-10-CM | POA: Insufficient documentation

## 2022-06-17 DIAGNOSIS — I10 Essential (primary) hypertension: Secondary | ICD-10-CM | POA: Insufficient documentation

## 2022-06-17 HISTORY — PX: ENDOSCOPIC MUCOSAL RESECTION: SHX6839

## 2022-06-17 HISTORY — PX: HEMOSTASIS CLIP PLACEMENT: SHX6857

## 2022-06-17 HISTORY — PX: COLONOSCOPY WITH PROPOFOL: SHX5780

## 2022-06-17 HISTORY — PX: POLYPECTOMY: SHX5525

## 2022-06-17 HISTORY — PX: HOT HEMOSTASIS: SHX5433

## 2022-06-17 HISTORY — PX: SUBMUCOSAL LIFTING INJECTION: SHX6855

## 2022-06-17 SURGERY — COLONOSCOPY WITH PROPOFOL
Anesthesia: Monitor Anesthesia Care

## 2022-06-17 MED ORDER — PROPOFOL 10 MG/ML IV BOLUS
INTRAVENOUS | Status: DC | PRN
  Administered 2022-06-17: 10 mg via INTRAVENOUS
  Administered 2022-06-17 (×2): 20 mg via INTRAVENOUS

## 2022-06-17 MED ORDER — GLYCOPYRROLATE PF 0.2 MG/ML IJ SOSY
PREFILLED_SYRINGE | INTRAMUSCULAR | Status: DC | PRN
  Administered 2022-06-17: .2 mg via INTRAVENOUS

## 2022-06-17 MED ORDER — LACTATED RINGERS IV SOLN
INTRAVENOUS | Status: AC | PRN
  Administered 2022-06-17: 10 mL/h via INTRAVENOUS

## 2022-06-17 MED ORDER — PROPOFOL 500 MG/50ML IV EMUL
INTRAVENOUS | Status: DC | PRN
  Administered 2022-06-17: 125 ug/kg/min via INTRAVENOUS

## 2022-06-17 MED ORDER — PROPOFOL 500 MG/50ML IV EMUL
INTRAVENOUS | Status: AC
  Filled 2022-06-17: qty 50

## 2022-06-17 SURGICAL SUPPLY — 22 items

## 2022-06-17 NOTE — Anesthesia Preprocedure Evaluation (Addendum)
Anesthesia Evaluation  Patient identified by MRN, date of birth, ID band Patient awake    Reviewed: Allergy & Precautions, NPO status , Patient's Chart, lab work & pertinent test results  Airway Mallampati: I  TM Distance: >3 FB Neck ROM: Full    Dental no notable dental hx. (+) Dental Advisory Given, Missing,    Pulmonary COPD, former smoker   Pulmonary exam normal breath sounds clear to auscultation       Cardiovascular hypertension, Pt. on medications + CAD  Normal cardiovascular exam Rhythm:Regular Rate:Normal     Neuro/Psych  Headaches PSYCHIATRIC DISORDERS  Depression       GI/Hepatic Neg liver ROS,GERD  ,,  Endo/Other  negative endocrine ROS    Renal/GU negative Renal ROS  negative genitourinary   Musculoskeletal negative musculoskeletal ROS (+)    Abdominal   Peds  Hematology negative hematology ROS (+)   Anesthesia Other Findings   Reproductive/Obstetrics                             Anesthesia Physical Anesthesia Plan  ASA: 3  Anesthesia Plan: MAC   Post-op Pain Management:    Induction: Intravenous  PONV Risk Score and Plan: Propofol infusion and Treatment may vary due to age or medical condition  Airway Management Planned: Natural Airway  Additional Equipment:   Intra-op Plan:   Post-operative Plan:   Informed Consent: I have reviewed the patients History and Physical, chart, labs and discussed the procedure including the risks, benefits and alternatives for the proposed anesthesia with the patient or authorized representative who has indicated his/her understanding and acceptance.     Dental advisory given  Plan Discussed with: CRNA  Anesthesia Plan Comments:        Anesthesia Quick Evaluation

## 2022-06-17 NOTE — Transfer of Care (Signed)
Immediate Anesthesia Transfer of Care Note  Patient: Laura Hayes  Procedure(s) Performed: COLONOSCOPY WITH PROPOFOL ENDOSCOPIC MUCOSAL RESECTION ARGON LASER APPLICATION POLYPECTOMY HEMOSTASIS CLIP PLACEMENT  Patient Location: PACU and Endoscopy Unit  Anesthesia Type:MAC  Level of Consciousness: awake, alert , oriented, and patient cooperative  Airway & Oxygen Therapy: Patient Spontanous Breathing  Post-op Assessment: Report given to RN and Post -op Vital signs reviewed and stable  Post vital signs: Reviewed and stable  Last Vitals:  Vitals Value Taken Time  BP 96/61 06/17/22 1200  Temp 36.4 C 06/17/22 1159  Pulse 56 06/17/22 1200  Resp 10 06/17/22 1200  SpO2 100 % 06/17/22 1200  Vitals shown include unvalidated device data.  Last Pain:  Vitals:   06/17/22 1159  TempSrc: Temporal  PainSc:          Complications: No notable events documented.

## 2022-06-17 NOTE — Op Note (Signed)
[facility] Patient Name: Laura Hayes Procedure Date: 06/17/2022 MRN: [id number] Attending MD: Justice Britain , MD, [id number] Date of Birth: 1956/01/12 CSN: [id number] Age: 67 Admit Type: Outpatient Procedure:                Colonoscopy Indications:              Excision of colonic polyp (incomplete hepatic                            flexure tubular adenoma resection in 2023 patient                            now here for attempted endoscopic advanced                            resection) Providers:                Justice Britain, MD, Glori Bickers, RN, Frazier Richards, Technician Referring MD:             Sela Hilding, Lear Ng, MD Medicines:                Monitored Anesthesia Care Complications:            No immediate complications. Estimated Blood Loss:     Estimated blood loss was minimal. Procedure:                Pre-Anesthesia Assessment:                           - Prior to the procedure, a History and Physical                            was performed, and patient medications and                            allergies were reviewed. The patient's tolerance of                            previous anesthesia was also reviewed. The risks                            and benefits of the procedure and the sedation                            options and risks were discussed with the patient.                            All questions were answered, and informed consent                            was obtained. Prior Anticoagulants: The patient has  taken no anticoagulant or antiplatelet agents                            except for aspirin. ASA Grade Assessment: III - A                            patient with severe systemic disease. After                            reviewing the risks and benefits, the patient was                            deemed in satisfactory condition to undergo the                             procedure.                           After obtaining informed consent, the colonoscope                            was passed under direct vision. Throughout the                            procedure, the patient's blood pressure, pulse, and                            oxygen saturations were monitored continuously. The                            [id number]) Olympus colonoscope was                            introduced through the anus and advanced to the the                            cecum, identified by appendiceal orifice and                            ileocecal valve. The colonoscopy was technically                            difficult and complex. Successful completion of the                            procedure was aided by changing the patient's                            position, straightening and shortening the scope to                            obtain bowel loop reduction and using scope  torsion. The patient tolerated the procedure. The                            quality of the bowel preparation was fair. The                            ileocecal valve, appendiceal orifice, and rectum                            were photographed. Scope In: 11:11:22 AM Scope Out: 11:53:21 AM Scope Withdrawal Time: 0 hours 36 minutes 44 seconds  Total Procedure Duration: 0 hours 41 minutes 59 seconds  Findings:      The digital rectal exam findings include hemorrhoids. Pertinent       negatives include no palpable rectal lesions.      A large amount of semi-liquid stool was found in the entire colon,       interfering with visualization. Lavage of the area was performed using       copious amounts, resulting in clearance with fair visualization.      A 3 mm polyp was found in the cecum just next to the appendiceal orifice       but not invading it. The polyp was sessile. The polyp was removed with a       cold snare. Resection and retrieval  were complete.      A 25 mm polyp was found in the hepatic flexure region. A tattoo was       lateral to the lesion. Scar was present on the lateral margin of the       polyp likely from previous attempt at resection. The polyp was sessile.       Preparations were made for attempt mucosal resection. Demarcation of the       lesion was performed with high-definition white light and narrow band       imaging to clearly identify the boundaries of the lesion. EverLift was       injected to raise the lesion, unfortunately the Everlift did not have       adequate lift of this polyp completely. Piecemeal mucosal resection       using a snare was performed. Resection was incomplete. The resected       tissue was retrieved. To further attempt resection of the tacked down       region, fulguration to ablate the lesion remnants by forcep avulsion was       successful. Subsequently performed fulguration to ablate the margin and       base of the lesion by argon plasma (right colon settings) was       successful. To prevent bleeding after mucosal resection, four hemostatic       clips were successfully placed (MR conditional). Clip manufacturer:       [employer]. There was no bleeding at the end of the procedure.      Non-bleeding non-thrombosed external and internal hemorrhoids were found       during retroflexion, during perianal exam and during digital exam. The       hemorrhoids were Grade II (internal hemorrhoids that prolapse but reduce       spontaneously). Impression:               - Preparation of the colon was fair even after  extensive lavage.                           - Hemorrhoids found on digital rectal exam.                           - One 3 mm polyp in the cecum, removed with a cold                            snare. Resected and retrieved.                           - One 25 mm polyp at the hepatic flexure (scar                            noted laterally  with tattoo even more lateral),                            removed with piecemeal mucosal resection                            incompletely. Forcep avulsion performed. Treated                            with argon plasma coagulation (APC). Clips (MR                            conditional) were placed. Clip manufacturer: [place] non-thrombosed external and internal                            hemorrhoids. Moderate Sedation:      Not Applicable - Patient had care per Anesthesia. Recommendation:           - The patient will be observed post-procedure,                            until all discharge criteria are met.                           - Discharge patient to home.                           - Patient has a contact number available for                            emergencies. The signs and symptoms of potential                            delayed complications were discussed with the  patient. Return to normal activities tomorrow.                            Written discharge instructions were provided to the                            patient.                           - High fiber diet.                           - Use FiberCon 1-2 tablets PO daily.                           - Minimize all NSAID medications (Aleve, Motrin,                            Ibuprofen, BC/Goody Powders, Naproxen) as able.                           - Continue present medications.                           - Await pathology results.                           - Repeat colonoscopy in 6 to 9 months for                            surveillance after piecemeal polypectomy.                           - The findings and recommendations were discussed                            with the patient.                           - The findings and recommendations were discussed                            with the patient's  family. Procedure Code(s):        --- Professional ---                           [phone], Colonoscopy, flexible; with endoscopic                            mucosal resection                           45385, 22, Colonoscopy, flexible; with removal of                            tumor(s), polyp(s), or other lesion(s) by snare  technique Diagnosis Code(s):        --- Professional ---                           K64.1, Second degree hemorrhoids                           D12.0, Benign neoplasm of cecum                           D12.3, Benign neoplasm of transverse colon (hepatic                            flexure or splenic flexure)                           K63.5, Polyp of colon CPT copyright 2022 American Medical Association. All rights reserved. The codes documented in this report are preliminary and upon coder review may  be revised to meet current compliance requirements. Justice Britain, MD 06/17/2022 12:13:12 PM Number of Addenda: 0

## 2022-06-17 NOTE — Discharge Instructions (Signed)

## 2022-06-17 NOTE — Anesthesia Postprocedure Evaluation (Signed)
Anesthesia Post Note  Patient: TAMANIKA HIGHAM  Procedure(s) Performed: COLONOSCOPY WITH PROPOFOL ENDOSCOPIC MUCOSAL RESECTION ARGON LASER APPLICATION POLYPECTOMY HEMOSTASIS CLIP PLACEMENT     Patient location during evaluation: PACU Anesthesia Type: MAC Level of consciousness: awake and alert Pain management: pain level controlled Vital Signs Assessment: post-procedure vital signs reviewed and stable Respiratory status: spontaneous breathing, nonlabored ventilation, respiratory function stable and patient connected to nasal cannula oxygen Cardiovascular status: stable and blood pressure returned to baseline Postop Assessment: no apparent nausea or vomiting Anesthetic complications: no  No notable events documented.  Last Vitals:  Vitals:   06/17/22 1200 06/17/22 1220  BP: 96/61 (!) 111/59  Pulse: 67 (!) 58  Resp: 15 19  Temp:    SpO2: 100% 99%    Last Pain:  Vitals:   06/17/22 1220  TempSrc:   PainSc: 0-No pain                 Illianna Paschal

## 2022-06-17 NOTE — H&P (Signed)
GASTROENTEROLOGY PROCEDURE H&P NOTE   Primary Care Physician: Glenis Smoker, MD  HPI: Laura Hayes is a 67 y.o. female who presents for Colonoscopy for EMR attempt of a previous worked on HF polyp in 6/23 with patient waiting months to schedule clinic visit to discuss attempt at resection for now followup colonoscopy.  Past Medical History:  Diagnosis Date   Agatston coronary artery calcium score between 200 and 399 09/2016   Coronary calcium score 172.  Mid LAD <50% & mid RCA 30% calcified lesions.  Nonflow limiting.;;  April 2021-calcium score now increased to 2 and 39.  Scattered aortic valve and ascending aortic calcification noted.   Aortic calcification (HCC)    Cluster headache    Colon polyps    COPD (chronic obstructive pulmonary disease) (HCC)    Depression    Essential hypertension    GERD (gastroesophageal reflux disease)    High blood pressure    Hypercholesteremia    Kidney stones 2015   Vitamin D deficiency    Past Surgical History:  Procedure Laterality Date   CESAREAN SECTION     HAND SURGERY Right    Current Facility-Administered Medications  Medication Dose Route Frequency Provider Last Rate Last Admin   lactated ringers infusion    Continuous PRN Mansouraty, Telford Nab., MD 10 mL/hr at 06/17/22 1000 10 mL/hr at 06/17/22 1000    Current Facility-Administered Medications:    lactated ringers infusion, , , Continuous PRN, Mansouraty, Telford Nab., MD, Last Rate: 10 mL/hr at 06/17/22 1000, 10 mL/hr at 06/17/22 1000 Allergies  Allergen Reactions   Zestril [Lisinopril] Cough   Family History  Problem Relation Age of Onset   Migraines Mother    Transient ischemic attack Mother    Dementia Mother    Hypertension Mother    Hyperlipidemia Mother    Colon polyps Mother    Lung disease Father    Hypertension Father    Emphysema Father    COPD Father    Coronary artery disease Father        s/p PCI, CABG   Heart disease Father     Hypertension Brother    Heart attack Brother    Stroke Brother    Stroke Maternal Grandmother    Heart attack Maternal Grandfather    Heart attack Paternal Grandfather    Other Son        Tricuspid atresa with VSD   Breast cancer Neg Hx    Colon cancer Neg Hx    Esophageal cancer Neg Hx    Inflammatory bowel disease Neg Hx    Liver disease Neg Hx    Pancreatic cancer Neg Hx    Rectal cancer Neg Hx    Stomach cancer Neg Hx    Social History   Socioeconomic History   Marital status: Married    Spouse name: Not on file   Number of children: 2   Years of education: 13   Highest education level: Not on file  Occupational History   Occupation: Photographer   Occupation: retired  Tobacco Use   Smoking status: Former    Types: E-cigarettes    Quit date: 03/06/2015    Years since quitting: 7.2   Smokeless tobacco: Never  Substance and Sexual Activity   Alcohol use: Yes    Alcohol/week: 1.0 standard drink of alcohol    Types: 1 Standard drinks or equivalent per week    Comment: Seldom - social   Drug use: No  Comment: past hx of marijuana use in 70's   Sexual activity: Yes  Other Topics Concern   Not on file  Social History Narrative   Lives at home with husband.   Had 3 total children, 2 living.  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   Right-handed.   2 liter caffeine daily (diet soda).   Social Determinants of Health   Financial Resource Strain: Not on file  Food Insecurity: Not on file  Transportation Needs: Not on file  Physical Activity: Not on file  Stress: Not on file  Social Connections: Not on file  Intimate Partner Violence: Not on file    Physical Exam: Today's Vitals   06/17/22 0957  BP: 125/74  Pulse: 72  Resp: 12  Temp: 97.9 F (36.6 C)  TempSrc: Temporal  SpO2: 99%  Weight: 56.7 kg  Height: 5\' 3"  (1.6 m)  PainSc: 0-No pain   Body mass index is 22.14 kg/m. GEN: NAD EYE: Sclerae  anicteric ENT: MMM CV: Non-tachycardic GI: Soft, NT/ND NEURO:  Alert & Oriented x 3  Lab Results: No results for input(s): "WBC", "HGB", "HCT", "PLT" in the last 72 hours. BMET No results for input(s): "NA", "K", "CL", "CO2", "GLUCOSE", "BUN", "CREATININE", "CALCIUM" in the last 72 hours. LFT No results for input(s): "PROT", "ALBUMIN", "AST", "ALT", "ALKPHOS", "BILITOT", "BILIDIR", "IBILI" in the last 72 hours. PT/INR No results for input(s): "LABPROT", "INR" in the last 72 hours.   Impression / Plan: This is a 67 y.o.female who presents for Colonoscopy for EMR attempt of a previous worked on HF polyp in 6/23 with patient waiting months to schedule clinic visit to discuss attempt at resection for now followup colonoscopy.  The risks and benefits of endoscopic evaluation/treatment were discussed with the patient and/or family; these include but are not limited to the risk of perforation, infection, bleeding, missed lesions, lack of diagnosis, severe illness requiring hospitalization, as well as anesthesia and sedation related illnesses.  The patient's history has been reviewed, patient examined, no change in status, and deemed stable for procedure.  The patient and/or family is agreeable to proceed.    Justice Britain, MD [facility] Advanced Endoscopy Office # [phone]

## 2022-06-17 NOTE — Anesthesia Procedure Notes (Signed)
Procedure Name: MAC Date/Time: 06/17/2022 11:05 AM  Performed by: Lollie Sails, CRNAPre-anesthesia Checklist: Patient identified, Emergency Drugs available, Suction available, Patient being monitored and Timeout performed Oxygen Delivery Method: Simple face mask Placement Confirmation: positive ETCO2

## 2022-06-19 ENCOUNTER — Encounter (HOSPITAL_COMMUNITY): Payer: Self-pay | Admitting: Gastroenterology

## 2022-06-19 LAB — SURGICAL PATHOLOGY

## 2022-06-25 ENCOUNTER — Encounter: Payer: Self-pay | Admitting: Gastroenterology

## 2022-07-01 DIAGNOSIS — L570 Actinic keratosis: Secondary | ICD-10-CM | POA: Diagnosis not present

## 2022-07-07 DIAGNOSIS — K080 Exfoliation of teeth due to systemic causes: Secondary | ICD-10-CM | POA: Diagnosis not present

## 2022-07-16 ENCOUNTER — Ambulatory Visit: Payer: Medicare Other | Admitting: Podiatry

## 2022-07-16 DIAGNOSIS — L84 Corns and callosities: Secondary | ICD-10-CM | POA: Diagnosis not present

## 2022-07-16 DIAGNOSIS — M2042 Other hammer toe(s) (acquired), left foot: Secondary | ICD-10-CM

## 2022-07-16 NOTE — Progress Notes (Signed)
  Subjective:  Patient ID: Laura Hayes, female    DOB: [birth year],  MRN: [id number]  Chief Complaint  Patient presents with   Callouses    Callus between 4th and 5th toe.  Knot on side of foot ongoing for about a year. Rarely hurts.    67 y.o. female presents with pain between 4th and 5th toe on left foot. States she has pain due to callus at this location. She has previously had it trimmed down. Has not tried gel toe cap.   Past Medical History:  Diagnosis Date   Agatston coronary artery calcium score between 200 and 399 09/2016   Coronary calcium score 172.  Mid LAD <50% & mid RCA 30% calcified lesions.  Nonflow limiting.;;  April 2021-calcium score now increased to 2 and 39.  Scattered aortic valve and ascending aortic calcification noted.   Aortic calcification (HCC)    Cluster headache    Colon polyps    COPD (chronic obstructive pulmonary disease) (HCC)    Depression    Essential hypertension    GERD (gastroesophageal reflux disease)    High blood pressure    Hypercholesteremia    Kidney stones 2015   Vitamin D deficiency     Allergies  Allergen Reactions   Zestril [Lisinopril] Cough    ROS: Negative except as per HPI above  Objective:  General: AAO x3, NAD  Dermatological: Hyperkeratotic lesion with pain on palpation of the medial aspect of the left 5th toe.   Vascular:  Dorsalis Pedis artery and Posterior Tibial artery pedal pulses are 2/4 bilateral.  Capillary fill time < 3 sec to all digits.   Neruologic: Grossly intact via light touch bilateral. Protective threshold intact to all sites bilateral.   Musculoskeletal: Hammertoe of the left 5th toe.   Gait: Unassisted, Nonantalgic.   No images are attached to the encounter.  Radiographs:  Deferred Assessment:   1. Callus of toe   2. Hammertoe of left foot     Plan:  Patient was evaluated and treated and all questions answered.  #Callus of 5th toe left foot All symptomatic hyperkeratoses were  safely debrided with a sterile #15 blade to patient's level of comfort without incident. We discussed preventative and palliative care of these lesions including supportive and accommodative shoegear, padding, prefabricated and custom molded accommodative orthoses, use of a pumice stone and lotions/creams daily. - Recommend gel toe cap, dispnesed   Return in about 3 months (around 10/15/2022) for Callus L foot.          Corinna Gab, DPM [facility] / [facility]

## 2022-08-31 ENCOUNTER — Other Ambulatory Visit: Payer: Self-pay

## 2022-08-31 DIAGNOSIS — Z122 Encounter for screening for malignant neoplasm of respiratory organs: Secondary | ICD-10-CM

## 2022-08-31 DIAGNOSIS — Z87891 Personal history of nicotine dependence: Secondary | ICD-10-CM

## 2022-10-06 ENCOUNTER — Ambulatory Visit (INDEPENDENT_AMBULATORY_CARE_PROVIDER_SITE_OTHER): Payer: Medicare Other | Admitting: Acute Care

## 2022-10-06 ENCOUNTER — Encounter: Payer: Self-pay | Admitting: Acute Care

## 2022-10-06 DIAGNOSIS — Z87891 Personal history of nicotine dependence: Secondary | ICD-10-CM

## 2022-10-06 NOTE — Progress Notes (Signed)
Virtual Visit via Telephone Note  I connected with Ophelia Shoulder on 10/06/22 at  4:00 PM EDT by telephone and verified that I am speaking with the correct person using two identifiers.  Location: Patient:  At home  Provider: [address] [place], [place], [address]    I discussed the limitations, risks, security and privacy concerns of performing an evaluation and management service by telephone and the availability of in person appointments. I also discussed with the patient that there may be a patient responsible charge related to this service. The patient expressed understanding and agreed to proceed.   Shared Decision Making Visit Lung Cancer Screening Program [phone])   Eligibility: Age 33 y.o. Pack Years Smoking History Calculation 43 pack year smoking history (# packs/per year x # years smoked) Recent History of coughing up blood  no Unexplained weight loss? no ( >Than 15 pounds within the last 6 months ) Prior History Lung / other cancer no (Diagnosis within the last 5 years already requiring surveillance chest CT Scans). Smoking Status Former Smoker Former Smokers: Years since quit: 8 years  Quit Date: 03/06/2015  Visit Components: Discussion included one or more decision making aids. Yes Discussion included risk/benefits of screening. yes Discussion included potential follow up diagnostic testing for abnormal scans. yes Discussion included meaning and risk of over diagnosis. yes Discussion included meaning and risk of False Positives. yes Discussion included meaning of total radiation exposure. yes  Counseling Included: Importance of adherence to annual lung cancer LDCT screening. yes Impact of comorbidities on ability to participate in the program. yes Ability and willingness to under diagnostic treatment. yes  Smoking Cessation Counseling: Current Smokers:  Discussed importance of smoking cessation. yes Information about tobacco cessation classes  and interventions provided to patient. yes Patient provided with "ticket" for LDCT Scan. yes Symptomatic Patient. no  Counseling NA Diagnosis Code: Tobacco Use Z72.0 Asymptomatic Patient yes  Counseling  NA, former smoker Former Smokers:  Discussed the importance of maintaining cigarette abstinence. yes Diagnosis Code: Personal History of Nicotine Dependence. V42.595 Information about tobacco cessation classes and interventions provided to patient. Yes Patient provided with "ticket" for LDCT Scan. yes Written Order for Lung Cancer Screening with LDCT placed in Epic. Yes (CT Chest Lung Cancer Screening Low Dose W/O CM) [id number] Z12.2-Screening of respiratory organs Z87.891-Personal history of nicotine dependence  I spent 25 minutes of face to face time/virtual visit time  with  Ms. Rochefort discussing the risks and benefits of lung cancer screening. We took the time to pause the power point at intervals to allow for questions to be asked and answered to ensure understanding. We discussed that she had taken the single most powerful action possible to decrease her risk of developing lung cancer when she quit smoking. I counseled her to remain smoke free, and to contact me if she ever had the desire to smoke again so that I can provide resources and tools to help support the effort to remain smoke free. We discussed the time and location of the scan, and that either  Abigail Miyamoto RN, Karlton Lemon, RN or I  or I will call / send a letter with the results within  24-72 hours of receiving them. She has the office contact information in the event she needs to speak with me,  she verbalized understanding of all of the above and had no further questions upon leaving the office.     I explained to the patient that there has been a  high incidence of coronary artery disease noted on these exams. I explained that this is a non-gated exam therefore degree or severity cannot be determined. This patient is on  statin therapy. I have asked the patient to follow-up with their PCP regarding any incidental finding of coronary artery disease and management with diet or medication as they feel is clinically indicated. The patient verbalized understanding of the above and had no further questions.     Bevelyn Ngo, NP 10/06/2022

## 2022-10-06 NOTE — Patient Instructions (Signed)

## 2022-10-07 ENCOUNTER — Ambulatory Visit
Admission: RE | Admit: 2022-10-07 | Discharge: 2022-10-07 | Disposition: A | Discharge status: Home / Self Care | Payer: Medicare Other | Source: Ambulatory Visit | Attending: Family Medicine | Admitting: Family Medicine

## 2022-10-07 DIAGNOSIS — Z87891 Personal history of nicotine dependence: Secondary | ICD-10-CM

## 2022-10-07 DIAGNOSIS — Z122 Encounter for screening for malignant neoplasm of respiratory organs: Secondary | ICD-10-CM

## 2022-10-12 ENCOUNTER — Other Ambulatory Visit: Payer: Self-pay | Admitting: Family Medicine

## 2022-10-12 ENCOUNTER — Ambulatory Visit
Admission: RE | Admit: 2022-10-12 | Discharge: 2022-10-12 | Disposition: A | Discharge status: Home / Self Care | Payer: Medicare Other | Source: Ambulatory Visit | Attending: Family Medicine | Admitting: Family Medicine

## 2022-10-12 DIAGNOSIS — Z1231 Encounter for screening mammogram for malignant neoplasm of breast: Secondary | ICD-10-CM | POA: Diagnosis not present

## 2022-10-13 ENCOUNTER — Other Ambulatory Visit: Payer: Self-pay | Admitting: Acute Care

## 2022-10-13 DIAGNOSIS — Z87891 Personal history of nicotine dependence: Secondary | ICD-10-CM

## 2022-10-13 DIAGNOSIS — Z122 Encounter for screening for malignant neoplasm of respiratory organs: Secondary | ICD-10-CM

## 2022-10-15 ENCOUNTER — Ambulatory Visit: Payer: Medicare Other | Admitting: Podiatry

## 2022-10-21 IMAGING — MG MM DIGITAL SCREENING BILAT W/ TOMO AND CAD
8 series · 8 of 24 positions shown · non-contrast
Comparison: Previous exam(s).

CLINICAL DATA: Screening.

EXAM:
DIGITAL SCREENING BILATERAL MAMMOGRAM WITH TOMO AND CAD

[L MLO synth-2D]
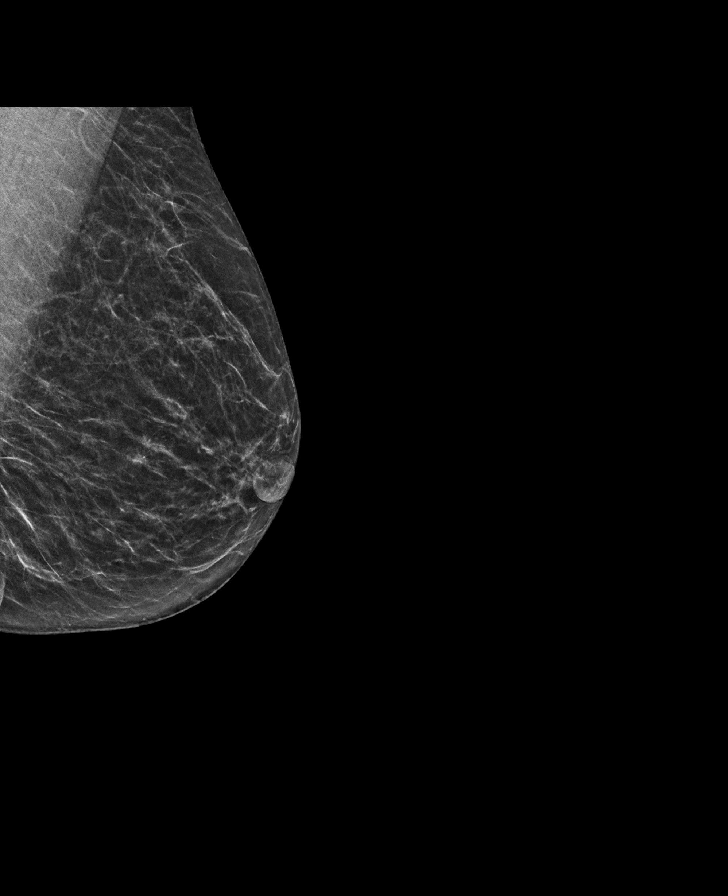

[R CC synth-2D]
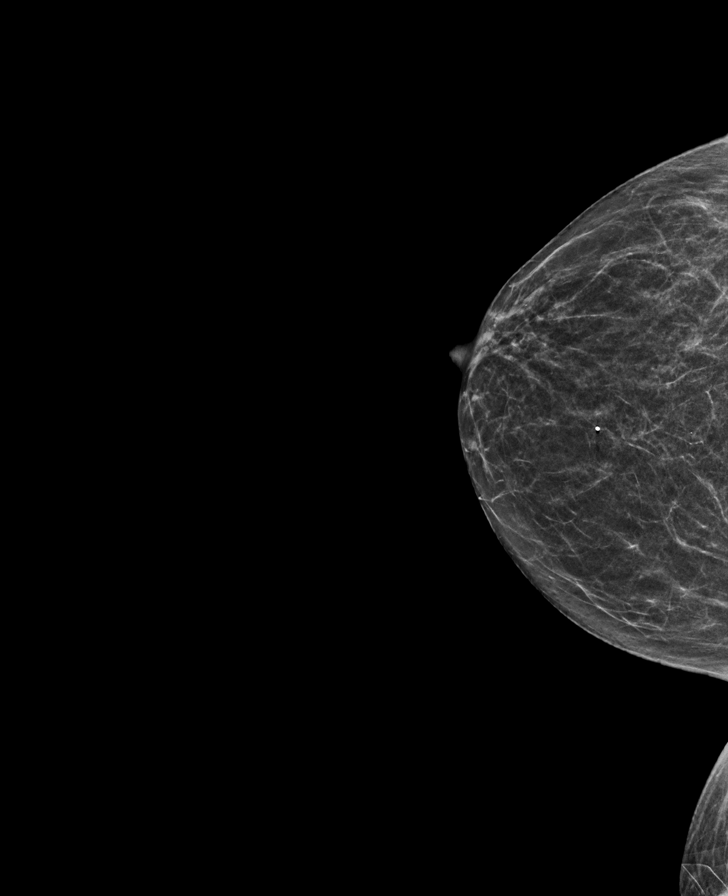

[L CC synth-2D]
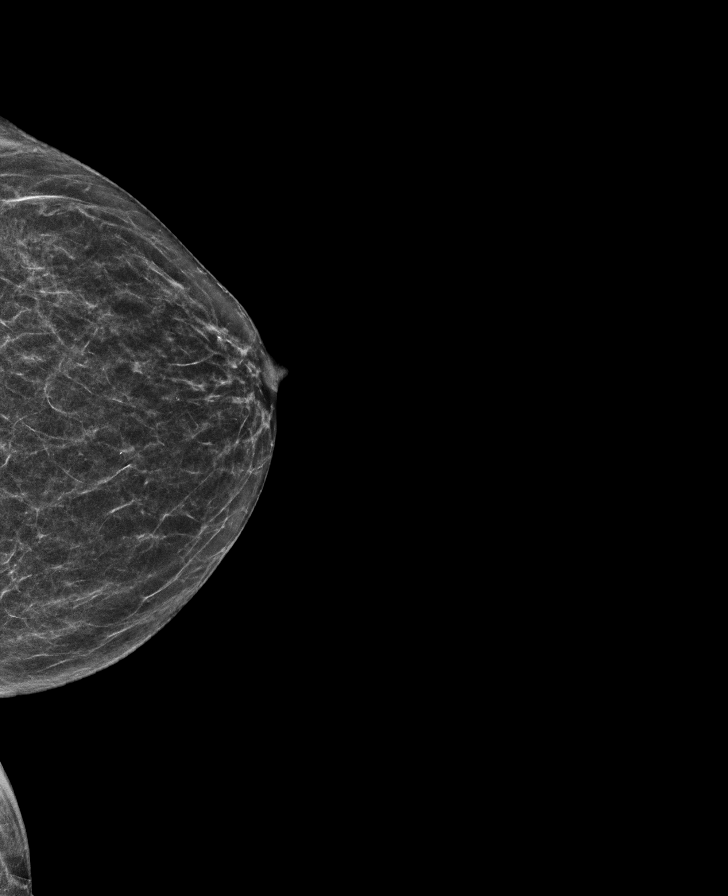

[R MLO synth-2D]
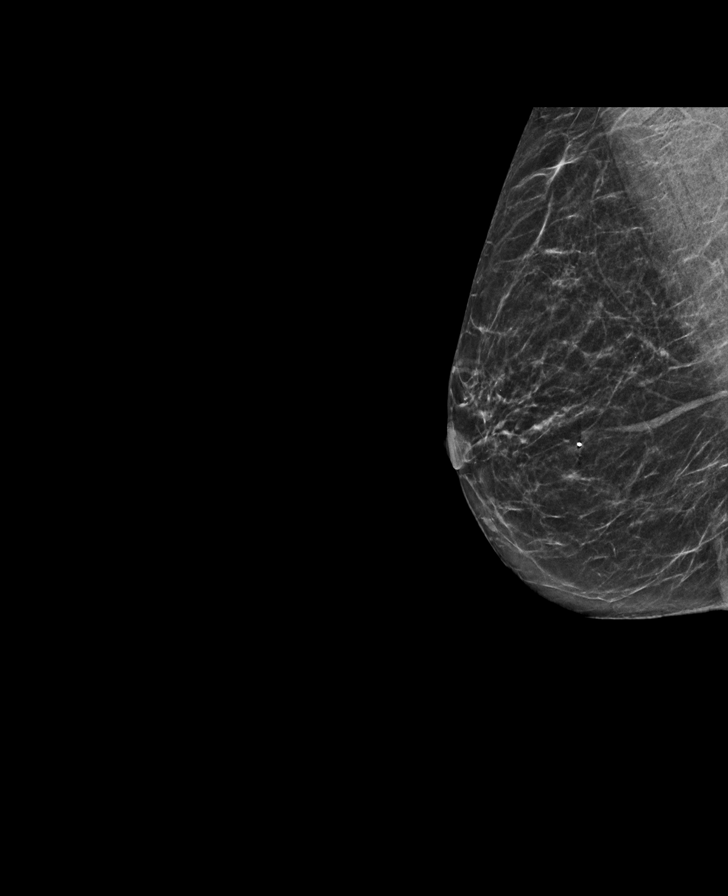

[R CC tomo · tomo slice 23/46.0]
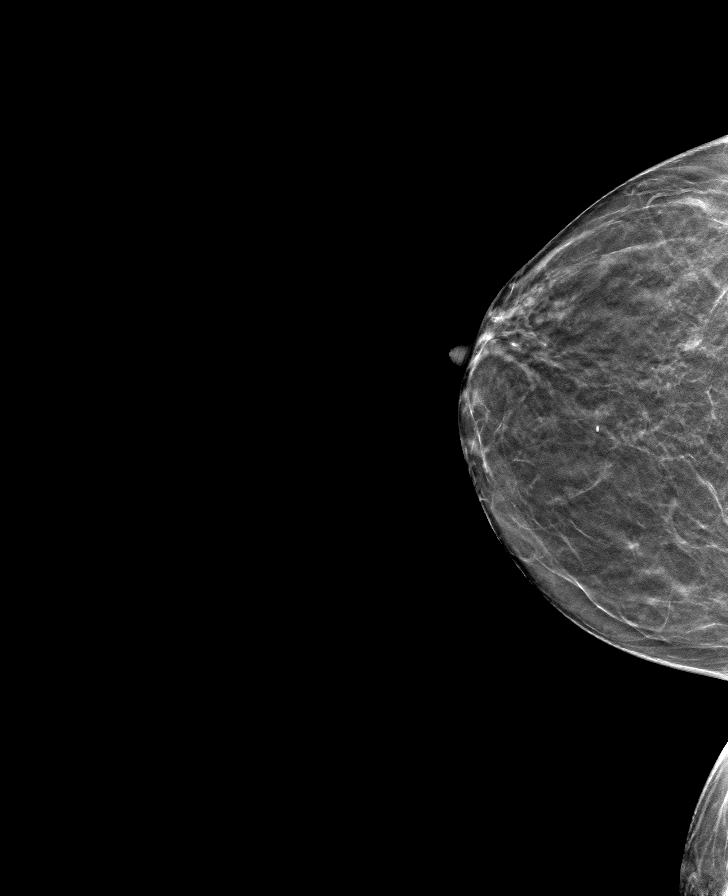

[L CC tomo · tomo slice 25/49.0]
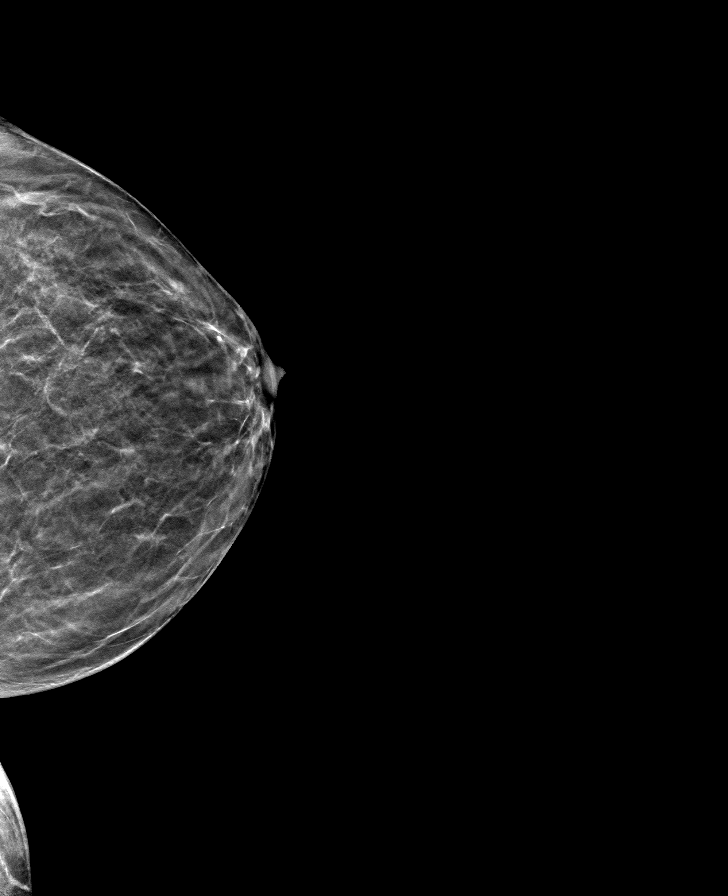

[R MLO tomo · tomo slice 25/49.0]
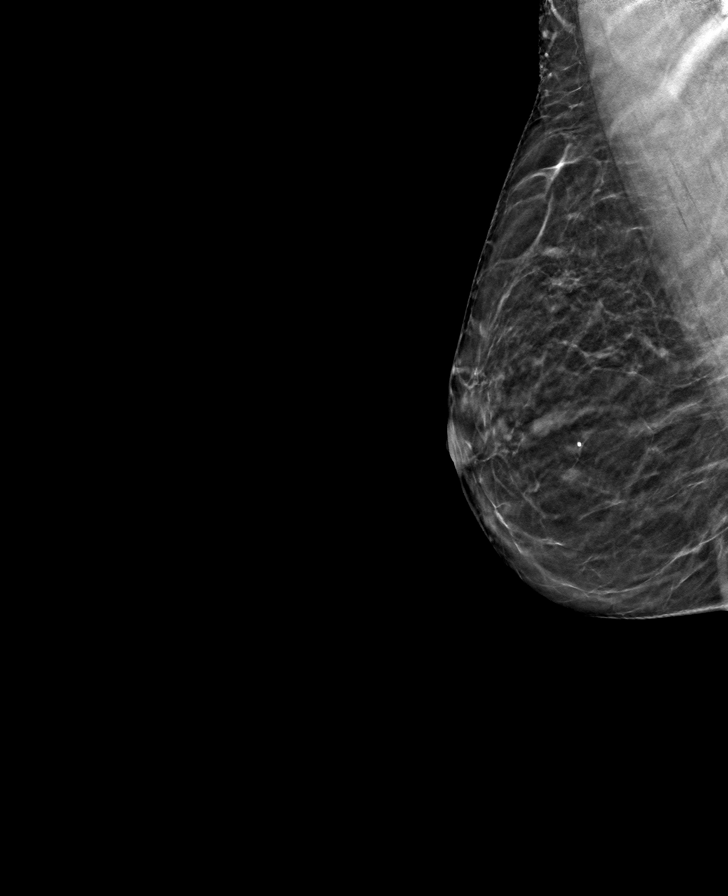

[L MLO tomo · tomo slice 27/52.0]
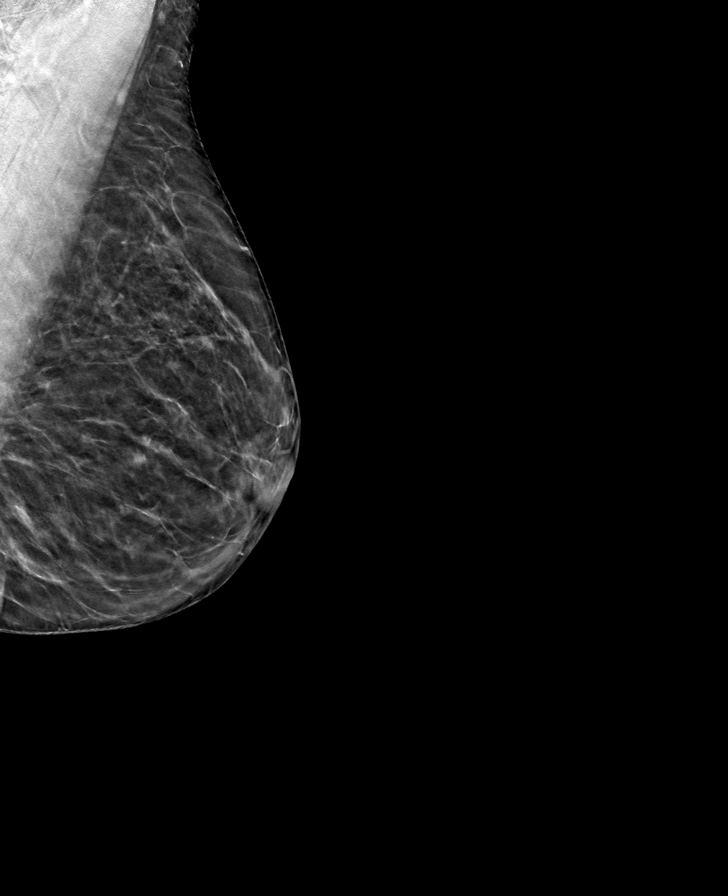

[8 of 24 positions shown; findings below may reference images not displayed]

ACR Breast Density Category b: There are scattered areas of
fibroglandular density.
FINDINGS: There are no findings suspicious for malignancy. The images were
evaluated with computer-aided detection.
IMPRESSION: No mammographic evidence of malignancy. A result letter of this
screening mammogram will be mailed directly to the patient.

RECOMMENDATION:
Screening mammogram in one year. (Code:ZP-7-VX7)

BI-RADS CATEGORY  1: Negative.

## 2023-01-18 DIAGNOSIS — K080 Exfoliation of teeth due to systemic causes: Secondary | ICD-10-CM | POA: Diagnosis not present

## 2023-02-01 DIAGNOSIS — N1831 Chronic kidney disease, stage 3a: Secondary | ICD-10-CM | POA: Diagnosis not present

## 2023-02-01 DIAGNOSIS — J449 Chronic obstructive pulmonary disease, unspecified: Secondary | ICD-10-CM | POA: Diagnosis not present

## 2023-02-01 DIAGNOSIS — E7800 Pure hypercholesterolemia, unspecified: Secondary | ICD-10-CM | POA: Diagnosis not present

## 2023-02-01 DIAGNOSIS — Z9181 History of falling: Secondary | ICD-10-CM | POA: Diagnosis not present

## 2023-02-01 DIAGNOSIS — J439 Emphysema, unspecified: Secondary | ICD-10-CM | POA: Diagnosis not present

## 2023-02-01 DIAGNOSIS — I700 Atherosclerosis of aorta: Secondary | ICD-10-CM | POA: Diagnosis not present

## 2023-02-01 DIAGNOSIS — E559 Vitamin D deficiency, unspecified: Secondary | ICD-10-CM | POA: Diagnosis not present

## 2023-02-01 DIAGNOSIS — R710 Precipitous drop in hematocrit: Secondary | ICD-10-CM | POA: Diagnosis not present

## 2023-02-01 DIAGNOSIS — I10 Essential (primary) hypertension: Secondary | ICD-10-CM | POA: Diagnosis not present

## 2023-02-01 DIAGNOSIS — Z23 Encounter for immunization: Secondary | ICD-10-CM | POA: Diagnosis not present

## 2023-02-01 DIAGNOSIS — R7301 Impaired fasting glucose: Secondary | ICD-10-CM | POA: Diagnosis not present

## 2023-03-31 ENCOUNTER — Telehealth: Payer: Self-pay

## 2023-03-31 NOTE — Telephone Encounter (Signed)
-----   Message from [facility] sent at 03/31/2023  5:51 AM EST ----- Regarding: Follow-up colonoscopy Laura Hayes, I did her husband's colon with EMR yesterday. She wanted to get on the books for her follow-up colon with EMR. Please schedule her for March/April as able. Thanks. GM

## 2023-04-01 ENCOUNTER — Other Ambulatory Visit: Payer: Self-pay

## 2023-04-01 DIAGNOSIS — D123 Benign neoplasm of transverse colon: Secondary | ICD-10-CM

## 2023-04-01 MED ORDER — NA SULFATE-K SULFATE-MG SULF 17.5-3.13-1.6 GM/177ML PO SOLN: 1.0000 | Freq: Once | ORAL | 0 refills | Status: AC

## 2023-04-01 NOTE — Telephone Encounter (Signed)
 The pt has been set up for colon EMR at [facility] with GM on 05/27/23 at 1245 pm  Left message on machine to call back

## 2023-04-02 MED ORDER — NA SULFATE-K SULFATE-MG SULF 17.5-3.13-1.6 GM/177ML PO SOLN: 1.0000 | Freq: Once | ORAL | 0 refills | Status: AC

## 2023-04-02 NOTE — Telephone Encounter (Signed)
 Colon EMR scheduled, pt instructed and medications reviewed.  Patient instructions mailed to home.  Patient to call with any questions or concerns.  The pt will pick up prep within a week.

## 2023-04-21 DIAGNOSIS — K080 Exfoliation of teeth due to systemic causes: Secondary | ICD-10-CM | POA: Diagnosis not present

## 2023-05-17 ENCOUNTER — Telehealth: Payer: Self-pay

## 2023-05-17 NOTE — Telephone Encounter (Signed)
 Procedure:endo Procedure date: 05/27/23 Procedure location: [facility] Arrival Time: 10 Spoke with the patient Y/N: n Any prep concerns? n  Has the patient obtained the prep from the pharmacy ? y Do you have a care partner and transportation: y Any additional concerns? n

## 2023-05-18 NOTE — Telephone Encounter (Signed)
 Patient called and stated that she had received a call from for someone but did not know where to call back. Patient is requesting a call back. Please advise.

## 2023-05-21 ENCOUNTER — Encounter (HOSPITAL_COMMUNITY): Payer: Self-pay | Admitting: Gastroenterology

## 2023-05-21 NOTE — Progress Notes (Signed)
 Attempted to obtain medical history for pre op call via telephone, unable to reach at this time. HIPAA compliant voicemail message left requesting return call to pre surgical testing department.

## 2023-05-25 ENCOUNTER — Telehealth: Payer: Self-pay

## 2023-05-25 NOTE — Telephone Encounter (Signed)
 I have spoken with the pt and she has agreed to move up to 1015 am. She has been re-instructed and all questions answered

## 2023-05-25 NOTE — Telephone Encounter (Signed)
 Laura Hayes, Since we had to cancel the other person's ERCP, can we see if this patient can come sooner on Thursday. I will not backfill the slot, so I can be available for procedures at [facility] or [facility] on Thursday. Thanks. GM

## 2023-05-26 ENCOUNTER — Telehealth: Payer: Self-pay

## 2023-05-26 NOTE — Telephone Encounter (Signed)
 Patient is returning your call.

## 2023-05-26 NOTE — Telephone Encounter (Signed)
 The pt has been advised that the appt has been moved to 4/14.  She has been rescheduled and all questions answered

## 2023-05-26 NOTE — Telephone Encounter (Signed)
 This pt appt for 3/6 will need to be moved to 4/14 at 1015 am due to Dr Meridee Score having jury duty.  Left message on machine to call back

## 2023-06-28 ENCOUNTER — Telehealth: Payer: Self-pay

## 2023-06-28 NOTE — Telephone Encounter (Signed)
 Procedure:colon Procedure date: 07/05/23 Procedure location: [facility]l Arrival Time: 54 Spoke with the patient Y/N: y Any prep concerns? n  Has the patient obtained the prep from the pharmacy ? y Do you have a care partner and transportation: y Any additional concerns? n

## 2023-06-29 ENCOUNTER — Encounter (HOSPITAL_COMMUNITY): Payer: Self-pay | Admitting: Gastroenterology

## 2023-07-05 ENCOUNTER — Ambulatory Visit (HOSPITAL_COMMUNITY): Admitting: Anesthesiology

## 2023-07-05 ENCOUNTER — Ambulatory Visit (HOSPITAL_COMMUNITY)
Admission: RE | Admit: 2023-07-05 | Discharge: 2023-07-05 | Disposition: A | Discharge status: Home / Self Care | Payer: Medicare Other | Attending: Gastroenterology | Admitting: Gastroenterology

## 2023-07-05 ENCOUNTER — Encounter (HOSPITAL_COMMUNITY)
Admission: RE | Disposition: A | Discharge status: Home / Self Care | Payer: Self-pay | Source: Home / Self Care | Attending: Gastroenterology

## 2023-07-05 ENCOUNTER — Encounter (HOSPITAL_COMMUNITY): Payer: Self-pay | Admitting: Gastroenterology

## 2023-07-05 ENCOUNTER — Other Ambulatory Visit: Payer: Self-pay

## 2023-07-05 DIAGNOSIS — D123 Benign neoplasm of transverse colon: Secondary | ICD-10-CM

## 2023-07-05 DIAGNOSIS — Z860101 Personal history of adenomatous and serrated colon polyps: Secondary | ICD-10-CM | POA: Diagnosis not present

## 2023-07-05 DIAGNOSIS — Z860100 Personal history of colon polyps, unspecified: Secondary | ICD-10-CM | POA: Insufficient documentation

## 2023-07-05 DIAGNOSIS — J449 Chronic obstructive pulmonary disease, unspecified: Secondary | ICD-10-CM | POA: Diagnosis not present

## 2023-07-05 DIAGNOSIS — Z87891 Personal history of nicotine dependence: Secondary | ICD-10-CM | POA: Insufficient documentation

## 2023-07-05 DIAGNOSIS — I2510 Atherosclerotic heart disease of native coronary artery without angina pectoris: Secondary | ICD-10-CM | POA: Diagnosis not present

## 2023-07-05 DIAGNOSIS — Z1211 Encounter for screening for malignant neoplasm of colon: Secondary | ICD-10-CM

## 2023-07-05 DIAGNOSIS — Q438 Other specified congenital malformations of intestine: Secondary | ICD-10-CM | POA: Insufficient documentation

## 2023-07-05 DIAGNOSIS — D126 Benign neoplasm of colon, unspecified: Secondary | ICD-10-CM | POA: Diagnosis not present

## 2023-07-05 DIAGNOSIS — Z9889 Other specified postprocedural states: Secondary | ICD-10-CM | POA: Insufficient documentation

## 2023-07-05 DIAGNOSIS — Z8249 Family history of ischemic heart disease and other diseases of the circulatory system: Secondary | ICD-10-CM | POA: Insufficient documentation

## 2023-07-05 DIAGNOSIS — Z83719 Family history of colon polyps, unspecified: Secondary | ICD-10-CM | POA: Diagnosis not present

## 2023-07-05 DIAGNOSIS — K641 Second degree hemorrhoids: Secondary | ICD-10-CM | POA: Diagnosis not present

## 2023-07-05 DIAGNOSIS — K635 Polyp of colon: Secondary | ICD-10-CM | POA: Diagnosis not present

## 2023-07-05 DIAGNOSIS — K219 Gastro-esophageal reflux disease without esophagitis: Secondary | ICD-10-CM | POA: Diagnosis not present

## 2023-07-05 DIAGNOSIS — Q439 Congenital malformation of intestine, unspecified: Secondary | ICD-10-CM

## 2023-07-05 DIAGNOSIS — I10 Essential (primary) hypertension: Secondary | ICD-10-CM | POA: Diagnosis not present

## 2023-07-05 HISTORY — PX: POLYPECTOMY: SHX149

## 2023-07-05 HISTORY — PX: COLONOSCOPY WITH PROPOFOL: SHX5780

## 2023-07-05 SURGERY — COLONOSCOPY WITH PROPOFOL
Anesthesia: Monitor Anesthesia Care

## 2023-07-05 MED ORDER — SODIUM CHLORIDE 0.9 % IV SOLN: INTRAVENOUS | Status: DC

## 2023-07-05 MED ORDER — PROPOFOL 10 MG/ML IV BOLUS
INTRAVENOUS | Status: DC | PRN
  Administered 2023-07-05: 20 mg via INTRAVENOUS
  Administered 2023-07-05 (×2): 30 mg via INTRAVENOUS

## 2023-07-05 MED ORDER — PROPOFOL 1000 MG/100ML IV EMUL
INTRAVENOUS | Status: AC
  Filled 2023-07-05: qty 100

## 2023-07-05 MED ORDER — PROPOFOL 500 MG/50ML IV EMUL
INTRAVENOUS | Status: DC | PRN
  Administered 2023-07-05: 125 ug/kg/min via INTRAVENOUS

## 2023-07-05 SURGICAL SUPPLY — 20 items

## 2023-07-05 NOTE — Anesthesia Postprocedure Evaluation (Signed)
 Anesthesia Post Note  Patient: Laura Hayes  Procedure(s) Performed: COLONOSCOPY WITH PROPOFOL POLYPECTOMY, INTESTINE     Patient location during evaluation: PACU Anesthesia Type: MAC Level of consciousness: awake and alert Pain management: pain level controlled Vital Signs Assessment: post-procedure vital signs reviewed and stable Respiratory status: spontaneous breathing, nonlabored ventilation, respiratory function stable and patient connected to nasal cannula oxygen Cardiovascular status: stable and blood pressure returned to baseline Postop Assessment: no apparent nausea or vomiting Anesthetic complications: no   No notable events documented.  Last Vitals:  Vitals:   07/05/23 1010 07/05/23 1020  BP: 100/63 117/67  Pulse: (!) 53 (!) 54  Resp: 12 13  Temp:    SpO2: 97% 100%    Last Pain:  Vitals:   07/05/23 1010  TempSrc:   PainSc: 0-No pain                 Theotis Flake P Milo Schreier

## 2023-07-05 NOTE — Discharge Instructions (Signed)

## 2023-07-05 NOTE — H&P (Signed)
 GASTROENTEROLOGY PROCEDURE H&P NOTE   Primary Care Physician: Shon Hale, MD  HPI: Laura Hayes is a 68 y.o. female who presents for Colonoscopy for followup of previously resected HF TA in 2024 after prior outside incomplete resection.  Past Medical History:  Diagnosis Date   Agatston coronary artery calcium score between 200 and 399 09/2016   Coronary calcium score 172.  Mid LAD <50% & mid RCA 30% calcified lesions.  Nonflow limiting.;;  April 2021-calcium score now increased to 2 and 39.  Scattered aortic valve and ascending aortic calcification noted.   Aortic calcification (HCC)    Cluster headache    Colon polyps    COPD (chronic obstructive pulmonary disease) (HCC)    Depression    Essential hypertension    GERD (gastroesophageal reflux disease)    High blood pressure    Hypercholesteremia    Kidney stones 2015   Vitamin D deficiency    Past Surgical History:  Procedure Laterality Date   CESAREAN SECTION     COLONOSCOPY WITH PROPOFOL N/A 06/17/2022   Procedure: COLONOSCOPY WITH PROPOFOL;  Surgeon: Meridee Score Netty Starring., MD;  Location: [facility];  Service: Gastroenterology;  Laterality: N/A;   ENDOSCOPIC MUCOSAL RESECTION N/A 06/17/2022   Procedure: ENDOSCOPIC MUCOSAL RESECTION;  Surgeon: Meridee Score Netty Starring., MD;  Location: [facility];  Service: Gastroenterology;  Laterality: N/A;   HAND SURGERY Right    HEMOSTASIS CLIP PLACEMENT  06/17/2022   Procedure: HEMOSTASIS CLIP PLACEMENT;  Surgeon: Lemar Lofty., MD;  Location: [facility];  Service: Gastroenterology;;   HOT HEMOSTASIS N/A 06/17/2022   Procedure: HOT HEMOSTASIS (ARGON PLASMA COAGULATION/BICAP);  Surgeon: Lemar Lofty., MD;  Location: [facility];  Service: Gastroenterology;  Laterality: N/A;   POLYPECTOMY  06/17/2022   Procedure: POLYPECTOMY;  Surgeon: Meridee Score Netty Starring., MD;  Location: [facility];  Service: Gastroenterology;;   Sunnie Nielsen LIFTING INJECTION   06/17/2022   Procedure: SUBMUCOSAL LIFTING INJECTION;  Surgeon: Lemar Lofty., MD;  Location: [facility];  Service: Gastroenterology;;   No current facility-administered medications for this encounter.   No current facility-administered medications for this encounter. Allergies  Allergen Reactions   Zestril [Lisinopril] Cough   Family History  Problem Relation Age of Onset   Migraines Mother    Transient ischemic attack Mother    Dementia Mother    Hypertension Mother    Hyperlipidemia Mother    Colon polyps Mother    Lung disease Father    Hypertension Father    Emphysema Father    COPD Father    Coronary artery disease Father        s/p PCI, CABG   Heart disease Father    Hypertension Brother    Heart attack Brother    Stroke Brother    Stroke Maternal Grandmother    Heart attack Maternal Grandfather    Heart attack Paternal Grandfather    Other Son        Tricuspid atresa with VSD   Breast cancer Neg Hx    Colon cancer Neg Hx    Esophageal cancer Neg Hx    Inflammatory bowel disease Neg Hx    Liver disease Neg Hx    Pancreatic cancer Neg Hx    Rectal cancer Neg Hx    Stomach cancer Neg Hx    Social History   Socioeconomic History   Marital status: Married    Spouse name: Not on file   Number of children: 2   Years of education: 12  Highest education level: Not on file  Occupational History   Occupation: Pharmacist, community   Occupation: retired  Tobacco Use   Smoking status: Former    Types: E-cigarettes    Quit date: 03/06/2015    Years since quitting: 8.3   Smokeless tobacco: Never  Substance and Sexual Activity   Alcohol use: Yes    Alcohol/week: 1.0 standard drink of alcohol    Types: 1 Standard drinks or equivalent per week    Comment: Seldom - social   Drug use: No    Comment: past hx of marijuana use in 70's   Sexual activity: Yes  Other Topics Concern   Not on file  Social History Narrative   Lives at home with husband.    Had 3 total children, 2 living.  1 grandchild (she does lots of babysitting)   Retired Pharmacist, community.   Right-handed.   2 liter caffeine daily (diet soda).   Social Drivers of Corporate investment banker Strain: Not on file  Food Insecurity: Not on file  Transportation Needs: Not on file  Physical Activity: Not on file  Stress: Not on file  Social Connections: Not on file  Intimate Partner Violence: Not on file    Physical Exam: Today's Vitals   05/24/23 1107  Weight: 57 kg  Height: 5\' 3"  (1.6 m)   Body mass index is 22.26 kg/m. GEN: NAD EYE: Sclerae anicteric ENT: MMM CV: Non-tachycardic GI: Soft, NT/ND NEURO:  Alert & Oriented x 3  Lab Results: No results for input(s): "WBC", "HGB", "HCT", "PLT" in the last 72 hours. BMET No results for input(s): "NA", "K", "CL", "CO2", "GLUCOSE", "BUN", "CREATININE", "CALCIUM" in the last 72 hours. LFT No results for input(s): "PROT", "ALBUMIN", "AST", "ALT", "ALKPHOS", "BILITOT", "BILIDIR", "IBILI" in the last 72 hours. PT/INR No results for input(s): "LABPROT", "INR" in the last 72 hours.   Impression / Plan: This is a 68 y.o.female who presents for Colonoscopy for followup of previously resected HF TA in 2024 after prior outside incomplete resection.  The risks and benefits of endoscopic evaluation/treatment were discussed with the patient and/or family; these include but are not limited to the risk of perforation, infection, bleeding, missed lesions, lack of diagnosis, severe illness requiring hospitalization, as well as anesthesia and sedation related illnesses.  The patient's history has been reviewed, patient examined, no change in status, and deemed stable for procedure.  The patient and/or family is agreeable to proceed.    Yong Henle, MD [facility] Office # [phone]

## 2023-07-05 NOTE — Anesthesia Preprocedure Evaluation (Signed)
 Anesthesia Evaluation  Patient identified by MRN, date of birth, ID band Patient awake    Reviewed: Allergy & Precautions, NPO status , Patient's Chart, lab work & pertinent test results  Airway Mallampati: II  TM Distance: >3 FB Neck ROM: Full    Dental no notable dental hx.    Pulmonary COPD, former smoker   Pulmonary exam normal        Cardiovascular hypertension, + CAD   Rhythm:Regular Rate:Normal     Neuro/Psych  Headaches   Depression       GI/Hepatic Neg liver ROS,GERD  Medicated,,polyps   Endo/Other  negative endocrine ROS    Renal/GU   negative genitourinary   Musculoskeletal negative musculoskeletal ROS (+)    Abdominal Normal abdominal exam  (+)   Peds  Hematology negative hematology ROS (+)   Anesthesia Other Findings   Reproductive/Obstetrics                             Anesthesia Physical Anesthesia Plan  ASA: 2  Anesthesia Plan: MAC   Post-op Pain Management:    Induction: Intravenous  PONV Risk Score and Plan: 2 and Propofol infusion and Treatment may vary due to age or medical condition  Airway Management Planned: Simple Face Mask and Nasal Cannula  Additional Equipment: None  Intra-op Plan:   Post-operative Plan:   Informed Consent: I have reviewed the patients History and Physical, chart, labs and discussed the procedure including the risks, benefits and alternatives for the proposed anesthesia with the patient or authorized representative who has indicated his/her understanding and acceptance.     Dental advisory given  Plan Discussed with: CRNA  Anesthesia Plan Comments:        Anesthesia Quick Evaluation

## 2023-07-05 NOTE — Transfer of Care (Signed)
 Immediate Anesthesia Transfer of Care Note  Patient: Laura Hayes  Procedure(s) Performed: COLONOSCOPY WITH PROPOFOL POLYPECTOMY, INTESTINE  Patient Location: PACU  Anesthesia Type:MAC  Level of Consciousness: sedated  Airway & Oxygen Therapy: Patient Spontanous Breathing and Patient connected to face mask oxygen  Post-op Assessment: Report given to RN and Post -op Vital signs reviewed and stable  Post vital signs: Reviewed and stable  Last Vitals:  Vitals Value Taken Time  BP    Temp    Pulse    Resp    SpO2      Last Pain:  Vitals:   07/05/23 0907  TempSrc: Temporal  PainSc: 0-No pain         Complications: No notable events documented.

## 2023-07-05 NOTE — Op Note (Signed)
 [facility] Patient Name: Laura Hayes Procedure Date: 07/05/2023 MRN: [id number] Attending MD: Corliss Parish , MD, [id number] Date of Birth: 1955/08/15 CSN: [id number] Age: 68 Admit Type: Outpatient Procedure:                Colonoscopy Indications:              Surveillance: History of piecemeal removal adenoma                            on last colonoscopy (< 3 yrs) Previous incomplete                            TA resection in 2023 with EMR in 2024 now here for                            surveillance Providers:                Corliss Parish, MD, Norman Clay, RN, Kandice Robinsons, Technician Referring MD:             Shirley Friar, MD Medicines:                Monitored Anesthesia Care Complications:            No immediate complications. Estimated Blood Loss:     Estimated blood loss was minimal. Procedure:                Pre-Anesthesia Assessment:                           - Prior to the procedure, a History and Physical                            was performed, and patient medications and                            allergies were reviewed. The patient's tolerance of                            previous anesthesia was also reviewed. The risks                            and benefits of the procedure and the sedation                            options and risks were discussed with the patient.                            All questions were answered, and informed consent                            was obtained. Prior Anticoagulants: The patient has  taken no anticoagulant or antiplatelet agents                            except for aspirin. ASA Grade Assessment: III - A                            patient with severe systemic disease. After                            reviewing the risks and benefits, the patient was                            deemed in satisfactory condition to undergo the                             procedure.                           After obtaining informed consent, the colonoscope                            was passed under direct vision. Throughout the                            procedure, the patient's blood pressure, pulse, and                            oxygen saturations were monitored continuously. The                            CF-HQ190L ([id number]) Olympus colonoscope was                            introduced through the anus and advanced to the the                            cecum, identified by appendiceal orifice and                            ileocecal valve. The colonoscopy was somewhat                            difficult due to restricted mobility of the colon,                            significant looping and a tortuous colon.                            Successful completion of the procedure was aided by                            changing the patient's position, using manual  pressure, straightening and shortening the scope to                            obtain bowel loop reduction and using scope                            torsion. The patient tolerated the procedure. The                            quality of the bowel preparation was adequate. The                            ileocecal valve, appendiceal orifice, and rectum                            were photographed. Scope In: 9:27:23 AM Scope Out: 9:48:29 AM Scope Withdrawal Time: 0 hours 13 minutes 0 seconds  Total Procedure Duration: 0 hours 21 minutes 6 seconds  Findings:      The digital rectal exam findings include hemorrhoids. Pertinent       negatives include no palpable rectal lesions.      The colon (entire examined portion) was significantly tortuous and loopy.      A medium post mucosectomy scar was found at the hepatic flexure. The       scar tissue was healthy in appearance. There was no evidence of the       previous polyp. Tattoo was noted lateral to the  scar.      Two sessile polyps were found in the hepatic flexure. The polyps were 2       to 3 mm in size. These polyps were removed with a cold snare. Resection       and retrieval were complete.      Normal mucosa was found in the entire colon otherwise.      Non-bleeding non-thrombosed internal hemorrhoids were found during       retroflexion, during perianal exam and during digital exam. The       hemorrhoids were Grade II (internal hemorrhoids that prolapse but reduce       spontaneously). Impression:               - Hemorrhoids found on digital rectal exam.                           - Tortuous colon with significant looping.                           - Post mucosectomy scar at the hepatic flexure,                            with tattoo noted laterally.                           - Two 2 to 3 mm polyps at the hepatic flexure,                            removed with a cold snare. Resected and retrieved.                           -  Normal mucosa in the entire examined colon                            otherwise.                           - Non-bleeding non-thrombosed internal hemorrhoids. Moderate Sedation:      Not Applicable - Patient had care per Anesthesia. Recommendation:           - The patient will be observed post-procedure,                            until all discharge criteria are met.                           - Discharge patient to home.                           - Patient has a contact number available for                            emergencies. The signs and symptoms of potential                            delayed complications were discussed with the                            patient. Return to normal activities tomorrow.                            Written discharge instructions were provided to the                            patient.                           - High fiber diet.                           - Use FiberCon 1-2 tablets PO daily.                           -  Continue present medications.                           - Await pathology results.                           - Repeat colonoscopy in 3 years for surveillance.                            Patient should never go more than 5 years between                            colonoscopies due to history of advanced adenoma.                           -  The findings and recommendations were discussed                            with the patient.                           - The findings and recommendations were discussed                            with the patient's family. Procedure Code(s):        --- Professional ---                           [id number], Colonoscopy, flexible; with removal of                            tumor(s), polyp(s), or other lesion(s) by snare                            technique Diagnosis Code(s):        --- Professional ---                           Z86.010, Personal history of colonic polyps                           Z98.890, Other specified postprocedural states                           D12.3, Benign neoplasm of transverse colon (hepatic                            flexure or splenic flexure)                           K64.1, Second degree hemorrhoids                           Q43.8, Other specified congenital malformations of                            intestine CPT copyright 2022 American Medical Association. All rights reserved. The codes documented in this report are preliminary and upon coder review may  be revised to meet current compliance requirements. Yong Henle, MD 07/05/2023 9:57:31 AM Number of Addenda: 0

## 2023-07-07 ENCOUNTER — Encounter (HOSPITAL_COMMUNITY): Payer: Self-pay | Admitting: Gastroenterology

## 2023-07-07 LAB — SURGICAL PATHOLOGY

## 2023-07-08 ENCOUNTER — Encounter: Payer: Self-pay | Admitting: Gastroenterology

## 2023-10-08 ENCOUNTER — Ambulatory Visit
Admission: RE | Admit: 2023-10-08 | Discharge: 2023-10-08 | Disposition: A | Discharge status: Home / Self Care | Source: Ambulatory Visit | Attending: Acute Care | Admitting: Acute Care

## 2023-10-08 DIAGNOSIS — Z122 Encounter for screening for malignant neoplasm of respiratory organs: Secondary | ICD-10-CM

## 2023-10-08 DIAGNOSIS — Z87891 Personal history of nicotine dependence: Secondary | ICD-10-CM | POA: Diagnosis not present

## 2023-10-18 DIAGNOSIS — K080 Exfoliation of teeth due to systemic causes: Secondary | ICD-10-CM | POA: Diagnosis not present

## 2023-10-21 ENCOUNTER — Telehealth: Payer: Self-pay

## 2023-10-21 NOTE — Telephone Encounter (Signed)
 Called patient to review results. LVM to call office. New 4.7 mm nodule since last year. Pt's cardiologist is Dr. Anner.  She is also on statin therapy.   IMPRESSION: 1. Lung-RADS 3, probably benign findings. Short-term follow-up in 6 months is recommended with repeat low-dose chest CT without contrast (please use the following order, CT CHEST LCS NODULE FOLLOW-UP W/O CM). 2. Moderate multi-vessel coronary artery calcification. 3. Mild emphysema.  Airway inflammation 4. Nonobstructing right nephrolithiasis.   Aortic Atherosclerosis (ICD10-I70.0) and Emphysema (ICD10-J43.9).

## 2023-10-25 ENCOUNTER — Other Ambulatory Visit: Payer: Self-pay

## 2023-10-25 DIAGNOSIS — Z122 Encounter for screening for malignant neoplasm of respiratory organs: Secondary | ICD-10-CM

## 2023-10-25 DIAGNOSIS — R911 Solitary pulmonary nodule: Secondary | ICD-10-CM

## 2023-10-25 DIAGNOSIS — Z87891 Personal history of nicotine dependence: Secondary | ICD-10-CM

## 2023-10-25 NOTE — Telephone Encounter (Signed)
 Spoke with patient and reviewed recent Lung CT results. She is in agreement to complete a 6 month follow up scan 04/2024 to evaluate the new 4.7 mm nodule. Order placed. She is on a stain and followed by Cardiology. NFN.

## 2023-10-26 DIAGNOSIS — H5203 Hypermetropia, bilateral: Secondary | ICD-10-CM | POA: Diagnosis not present

## 2023-11-08 DIAGNOSIS — K080 Exfoliation of teeth due to systemic causes: Secondary | ICD-10-CM | POA: Diagnosis not present

## 2023-11-23 ENCOUNTER — Other Ambulatory Visit: Payer: Self-pay | Admitting: Family Medicine

## 2023-11-23 DIAGNOSIS — Z1231 Encounter for screening mammogram for malignant neoplasm of breast: Secondary | ICD-10-CM

## 2023-11-25 DIAGNOSIS — L821 Other seborrheic keratosis: Secondary | ICD-10-CM | POA: Diagnosis not present

## 2023-11-25 DIAGNOSIS — L570 Actinic keratosis: Secondary | ICD-10-CM | POA: Diagnosis not present

## 2023-11-25 DIAGNOSIS — L718 Other rosacea: Secondary | ICD-10-CM | POA: Diagnosis not present

## 2023-11-25 DIAGNOSIS — L578 Other skin changes due to chronic exposure to nonionizing radiation: Secondary | ICD-10-CM | POA: Diagnosis not present

## 2023-11-25 DIAGNOSIS — L814 Other melanin hyperpigmentation: Secondary | ICD-10-CM | POA: Diagnosis not present

## 2023-11-26 ENCOUNTER — Ambulatory Visit
Admission: RE | Admit: 2023-11-26 | Discharge: 2023-11-26 | Disposition: A | Discharge status: Home / Self Care | Source: Ambulatory Visit | Attending: Family Medicine | Admitting: Family Medicine

## 2023-11-26 DIAGNOSIS — Z1231 Encounter for screening mammogram for malignant neoplasm of breast: Secondary | ICD-10-CM | POA: Diagnosis not present

## 2024-02-10 DIAGNOSIS — I10 Essential (primary) hypertension: Secondary | ICD-10-CM | POA: Diagnosis not present

## 2024-02-10 DIAGNOSIS — I700 Atherosclerosis of aorta: Secondary | ICD-10-CM | POA: Diagnosis not present

## 2024-02-10 DIAGNOSIS — R7301 Impaired fasting glucose: Secondary | ICD-10-CM | POA: Diagnosis not present

## 2024-02-10 DIAGNOSIS — E7800 Pure hypercholesterolemia, unspecified: Secondary | ICD-10-CM | POA: Diagnosis not present

## 2024-02-10 DIAGNOSIS — E559 Vitamin D deficiency, unspecified: Secondary | ICD-10-CM | POA: Diagnosis not present

## 2024-02-10 DIAGNOSIS — Z0000 Encounter for general adult medical examination without abnormal findings: Secondary | ICD-10-CM | POA: Diagnosis not present

## 2024-02-10 DIAGNOSIS — Z23 Encounter for immunization: Secondary | ICD-10-CM | POA: Diagnosis not present

## 2024-02-10 DIAGNOSIS — R718 Other abnormality of red blood cells: Secondary | ICD-10-CM | POA: Diagnosis not present

## 2024-02-15 ENCOUNTER — Other Ambulatory Visit (HOSPITAL_BASED_OUTPATIENT_CLINIC_OR_DEPARTMENT_OTHER): Payer: Self-pay | Admitting: Family Medicine

## 2024-02-15 DIAGNOSIS — E2839 Other primary ovarian failure: Secondary | ICD-10-CM

## 2024-03-24 ENCOUNTER — Other Ambulatory Visit

## 2024-04-25 ENCOUNTER — Other Ambulatory Visit

## 2024-05-03 ENCOUNTER — Other Ambulatory Visit
